# Patient Record
Sex: Female | Born: 1950 | Race: Black or African American | Hispanic: No | Smoking: Current every day smoker
Health system: Southern US, Community
[De-identification: ages and names within clinical notes are randomized; demographics above are authoritative.]

## PROBLEM LIST (undated history)

## (undated) DIAGNOSIS — I10 Essential (primary) hypertension: Secondary | ICD-10-CM

## (undated) DIAGNOSIS — M1990 Unspecified osteoarthritis, unspecified site: Secondary | ICD-10-CM

## (undated) HISTORY — PX: CARPAL TUNNEL RELEASE: SHX101

## (undated) HISTORY — PX: JOINT REPLACEMENT: SHX530

## (undated) HISTORY — PX: BREAST SURGERY: SHX581

---

## 2001-07-14 ENCOUNTER — Encounter (HOSPITAL_COMMUNITY): Admission: RE | Admit: 2001-07-14 | Discharge: 2001-08-13 | Payer: Self-pay

## 2001-10-26 ENCOUNTER — Encounter: Payer: Self-pay | Admitting: Emergency Medicine

## 2001-10-26 ENCOUNTER — Emergency Department (HOSPITAL_COMMUNITY): Admission: EM | Admit: 2001-10-26 | Discharge: 2001-10-26 | Payer: Self-pay | Admitting: Emergency Medicine

## 2002-04-19 ENCOUNTER — Encounter (HOSPITAL_COMMUNITY): Admission: RE | Admit: 2002-04-19 | Discharge: 2002-05-19 | Payer: Self-pay | Admitting: Obstetrics and Gynecology

## 2002-05-27 ENCOUNTER — Encounter (HOSPITAL_COMMUNITY): Admission: RE | Admit: 2002-05-27 | Discharge: 2002-06-26 | Payer: Self-pay | Admitting: Obstetrics and Gynecology

## 2005-06-16 ENCOUNTER — Emergency Department (HOSPITAL_COMMUNITY): Admission: EM | Admit: 2005-06-16 | Discharge: 2005-06-16 | Payer: Self-pay | Admitting: Emergency Medicine

## 2012-07-06 ENCOUNTER — Encounter (HOSPITAL_COMMUNITY): Payer: Self-pay | Admitting: Emergency Medicine

## 2012-07-06 ENCOUNTER — Emergency Department (HOSPITAL_COMMUNITY): Payer: Non-veteran care

## 2012-07-06 ENCOUNTER — Emergency Department (HOSPITAL_COMMUNITY)
Admission: EM | Admit: 2012-07-06 | Discharge: 2012-07-07 | Disposition: A | Discharge status: Home / Self Care | Payer: Non-veteran care | Attending: Emergency Medicine | Admitting: Emergency Medicine

## 2012-07-06 DIAGNOSIS — W010XXA Fall on same level from slipping, tripping and stumbling without subsequent striking against object, initial encounter: Secondary | ICD-10-CM | POA: Insufficient documentation

## 2012-07-06 DIAGNOSIS — S86819A Strain of other muscle(s) and tendon(s) at lower leg level, unspecified leg, initial encounter: Secondary | ICD-10-CM | POA: Insufficient documentation

## 2012-07-06 DIAGNOSIS — S93609A Unspecified sprain of unspecified foot, initial encounter: Secondary | ICD-10-CM | POA: Insufficient documentation

## 2012-07-06 DIAGNOSIS — I10 Essential (primary) hypertension: Secondary | ICD-10-CM | POA: Insufficient documentation

## 2012-07-06 DIAGNOSIS — F17200 Nicotine dependence, unspecified, uncomplicated: Secondary | ICD-10-CM | POA: Insufficient documentation

## 2012-07-06 DIAGNOSIS — S93601A Unspecified sprain of right foot, initial encounter: Secondary | ICD-10-CM

## 2012-07-06 DIAGNOSIS — S838X9A Sprain of other specified parts of unspecified knee, initial encounter: Secondary | ICD-10-CM | POA: Insufficient documentation

## 2012-07-06 DIAGNOSIS — S8391XA Sprain of unspecified site of right knee, initial encounter: Secondary | ICD-10-CM

## 2012-07-06 DIAGNOSIS — E119 Type 2 diabetes mellitus without complications: Secondary | ICD-10-CM | POA: Insufficient documentation

## 2012-07-06 HISTORY — DX: Essential (primary) hypertension: I10

## 2012-07-06 HISTORY — DX: Unspecified osteoarthritis, unspecified site: M19.90

## 2012-07-06 MED ORDER — OXYCODONE-ACETAMINOPHEN 5-325 MG PO TABS
2.0000 | ORAL_TABLET | Freq: Once | ORAL | Status: AC
  Administered 2012-07-07: 2 via ORAL
  Filled 2012-07-06: qty 2

## 2012-07-06 NOTE — ED Notes (Signed)
Fall with injury to right lower leg - states leg "just gives away"  Treated with ice and elevation last pm - continues to have pain today.  Feels like something "pulled behind knee all the way down to ankle.  Very sore.

## 2012-07-06 NOTE — ED Provider Notes (Signed)
History     CSN: [id number]  Arrival date & time 07/06/12  2224   First MD Initiated Contact with Patient 07/06/12 2347      Chief Complaint  Patient presents with  . Fall    injury right leg    (Consider location/radiation/quality/duration/timing/severity/associated sxs/prior treatment) HPI Comments: Pt with several operations on her right knee in the past status post total knee arthroplasty with multiple revisions. She states that this knee goes out on her frequently causing her to fall. Today this happened while she was on a wooden surface causing her to fall to her right side and twisted her right leg under her body. She felt acute onset of pain behind her knee as well as in her ankle and has had difficulty walking since that time. She denies numbness, weakness, tingling and has no head injury or neck or back pain outside of her chronic back pain. 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  Patient is a 61 y.o. female presenting with fall. The history is provided by the patient and a friend.  Fall Pertinent negatives include no numbness.    Past Medical History  Diagnosis Date  . Arthritis   . Diabetes mellitus   . Hypertension     Past Surgical History  Procedure Date  . Joint replacement 1992 and 2001    right knee  . Breast surgery     bio-psy negative  . Carpal tunnel release     right    No family history on file.  History  Substance Use Topics  . Smoking status: Current Everyday Smoker    Types: Cigarettes  . Smokeless tobacco: Not on file  . Alcohol Use: No    OB History    Grav Para Term Preterm Abortions TAB SAB Ect Mult Living                  Review of Systems  Musculoskeletal: Positive for joint swelling.  Skin: Negative for wound.  Neurological: Negative for weakness and numbness.    Allergies  Review of patient's allergies indicates no known allergies.  Home Medications   Current Outpatient Rx    Name Route Sig Dispense Refill  . ASPIRIN 81 MG PO TABS Oral Take 81 mg by mouth daily.    Marland Kitchen GABAPENTIN 300 MG PO CAPS Oral Take 300 mg by mouth 3 (three) times daily.     Marland Kitchen HYDROCHLOROTHIAZIDE 25 MG PO TABS Oral Take 25 mg by mouth daily.    Marland Kitchen METFORMIN HCL 1000 MG PO TABS Oral Take 1,000 mg by mouth 3 (three) times daily between meals.     . METHADONE HCL 5 MG PO TABS Oral Take 5 mg by mouth every 8 (eight) hours. Back pain     . NAPROXEN 500 MG PO TABS Oral Take 1 tablet (500 mg total) by mouth 2 (two) times daily with a meal. 30 tablet 0    BP 121/61  Pulse 89  Temp 98.5 F (36.9 C) (Oral)  Resp 16  Ht 6' (1.829 m)  Wt 210 lb (95.255 kg)  BMI 28.48 kg/m2  SpO2 96%  Physical Exam  Nursing note and vitals reviewed. Constitutional: She appears well-developed and well-nourished. No distress.  HENT:  Head: Normocephalic and atraumatic.  Mouth/Throat: Oropharynx is clear and moist. No oropharyngeal exudate.  Eyes: Conjunctivae and EOM are normal. Pupils are equal, round, and reactive to light. Right eye exhibits no discharge. Left  eye exhibits no discharge. No scleral icterus.  Neck: Normal range of motion. Neck supple. No JVD present. No thyromegaly present.  Cardiovascular: Normal rate, regular rhythm, normal heart sounds and intact distal pulses.  Exam reveals no gallop and no friction rub.   No murmur heard. Pulmonary/Chest: Effort normal and breath sounds normal. No respiratory distress. She has no wheezes. She has no rales.  Abdominal: Soft. Bowel sounds are normal. She exhibits no distension and no mass. There is no tenderness.  Musculoskeletal: She exhibits tenderness ( mild swelling of the right knee, anterior scar well-healed, no erythema or warmth. Knee with decreased range of motion, right ankle with decreased range of motion, no tenderness over the malleoli bilaterally. There is midfoot tenderness right foott). She exhibits no edema.  Lymphadenopathy:    She has no  cervical adenopathy.  Neurological: She is alert. Coordination normal.       Normal sensation of the right lower extremity, no focal weakness  Skin: Skin is warm and dry. No rash noted. No erythema.  Psychiatric: She has a normal mood and affect. Her behavior is normal.    ED Course  Procedures (including critical care time)  Labs Reviewed - No data to display Dg Ankle Complete Right  07/07/2012  *RADIOLOGY REPORT*  Clinical Data: Fall.  Pain and swelling.  RIGHT ANKLE - COMPLETE 3+ VIEW  Comparison: None.  Findings: Imaged bones, joints and soft tissues appear normal.  IMPRESSION: Negative exam.   Original Report Authenticated By: Bernadene Bell. D'ALESSIO, M.D.    Dg Knee Complete 4 Views Right  07/07/2012  *RADIOLOGY REPORT*  Clinical Data: Fall, pain.  RIGHT KNEE - COMPLETE 4+ VIEW  Comparison: None.  Findings: Right total knee replacement is noted.  The device is located.  There is no fracture.  No joint effusion.  IMPRESSION: No acute finding.  Status post right knee replacement without evidence of complication.   Original Report Authenticated By: Bernadene Bell. D'ALESSIO, M.D.    Dg Foot Complete Right  07/07/2012  *RADIOLOGY REPORT*  Clinical Data: Fall.  Pain and swelling.  RIGHT FOOT COMPLETE - 3+ VIEW  Comparison: None.  Findings: Imaged bones, joints and soft tissues appear normal.  IMPRESSION: Negative exam.   Original Report Authenticated By: Bernadene Bell. D'ALESSIO, M.D.      1. Sprain of right knee   2. Sprain of right foot       MDM  Status post fall, possible fractures though suspect more strain and ligamentous injury, pain medication ordered, imaging, immobilization, ice and elevation.   Imaging reviewed and is negative for fractures or dislocations, knee immobilizer placed, rice therapy initiated, patient stable for discharge and followup with her orthopedist.  Discharge Prescriptions include:  Naprosyn Knee immob RICE therapy     Vida Roller, MD 07/07/12 [phone]

## 2012-07-07 MED ORDER — NAPROXEN 500 MG PO TABS: 500.0000 mg | ORAL_TABLET | Freq: Two times a day (BID) | ORAL | Status: AC

## 2013-01-09 IMAGING — CR DG ANKLE COMPLETE 3+V*R*
3 series · 3 of 3 positions shown · non-contrast
Comparison: None.

CLINICAL DATA: Fall.  Pain and swelling.

RIGHT ANKLE - COMPLETE 3+ VIEW

[view not recorded (1 of 3)]
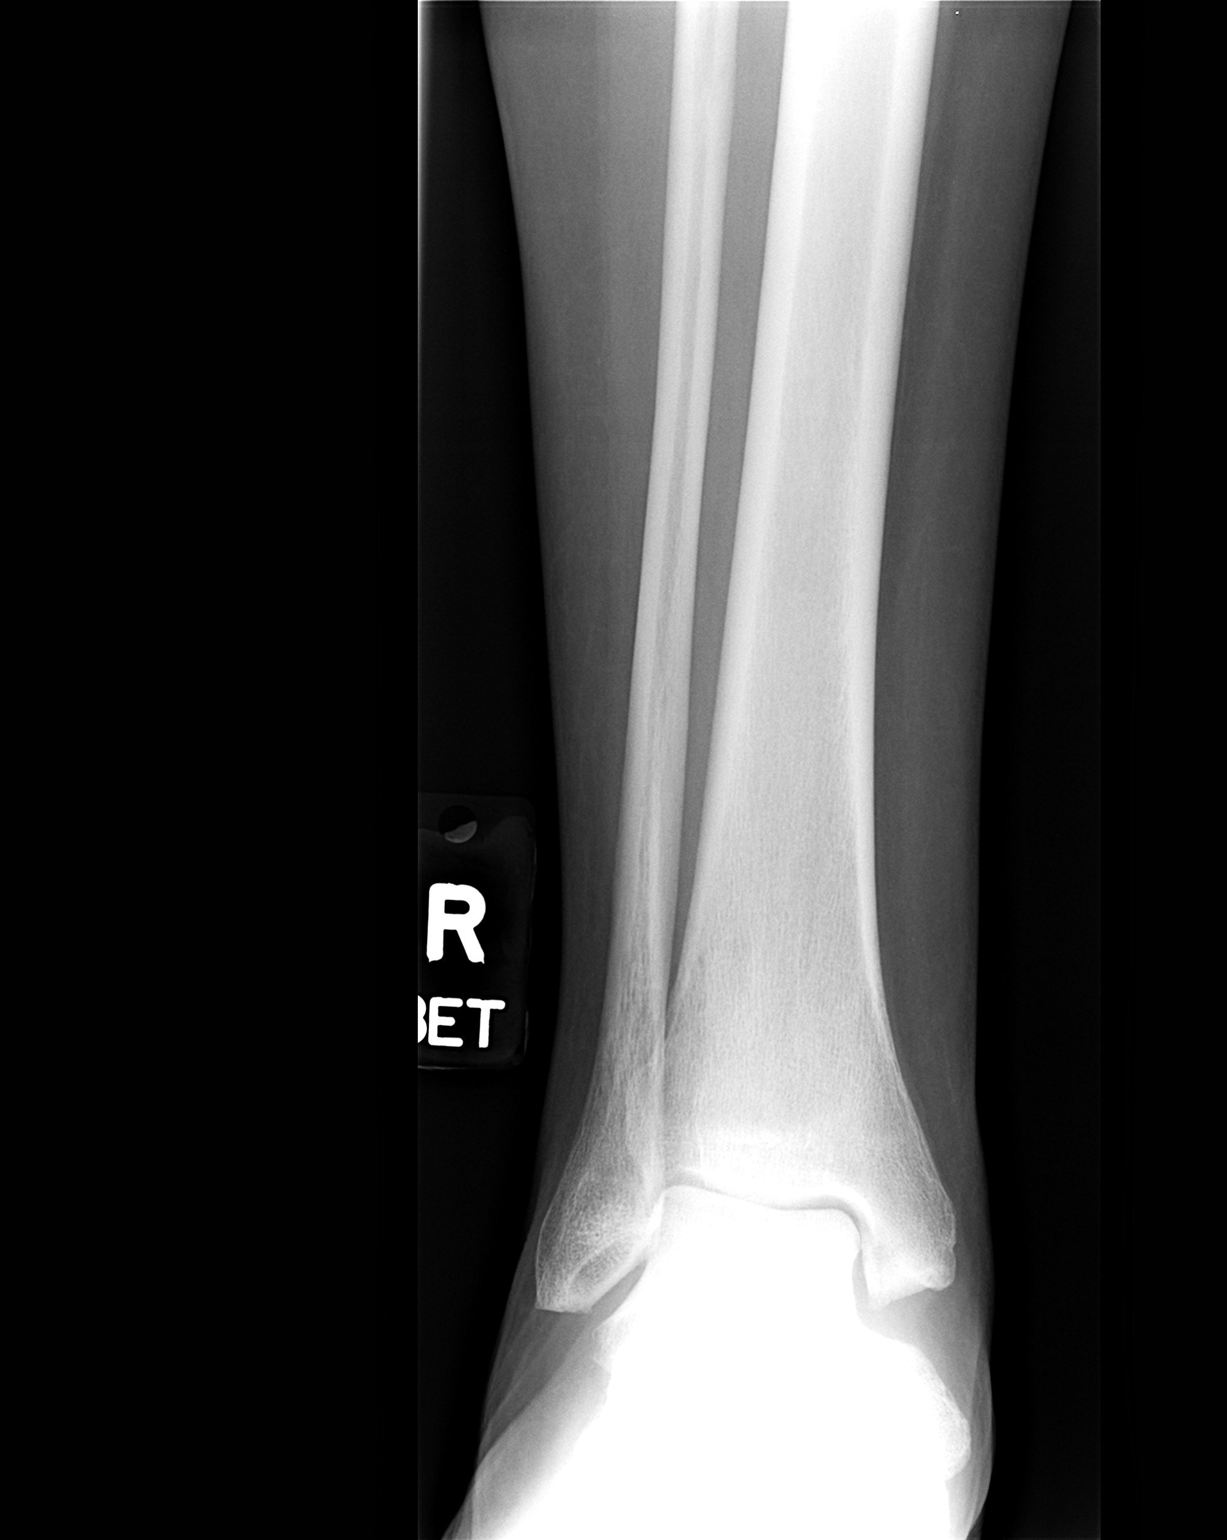

[view not recorded (2 of 3)]
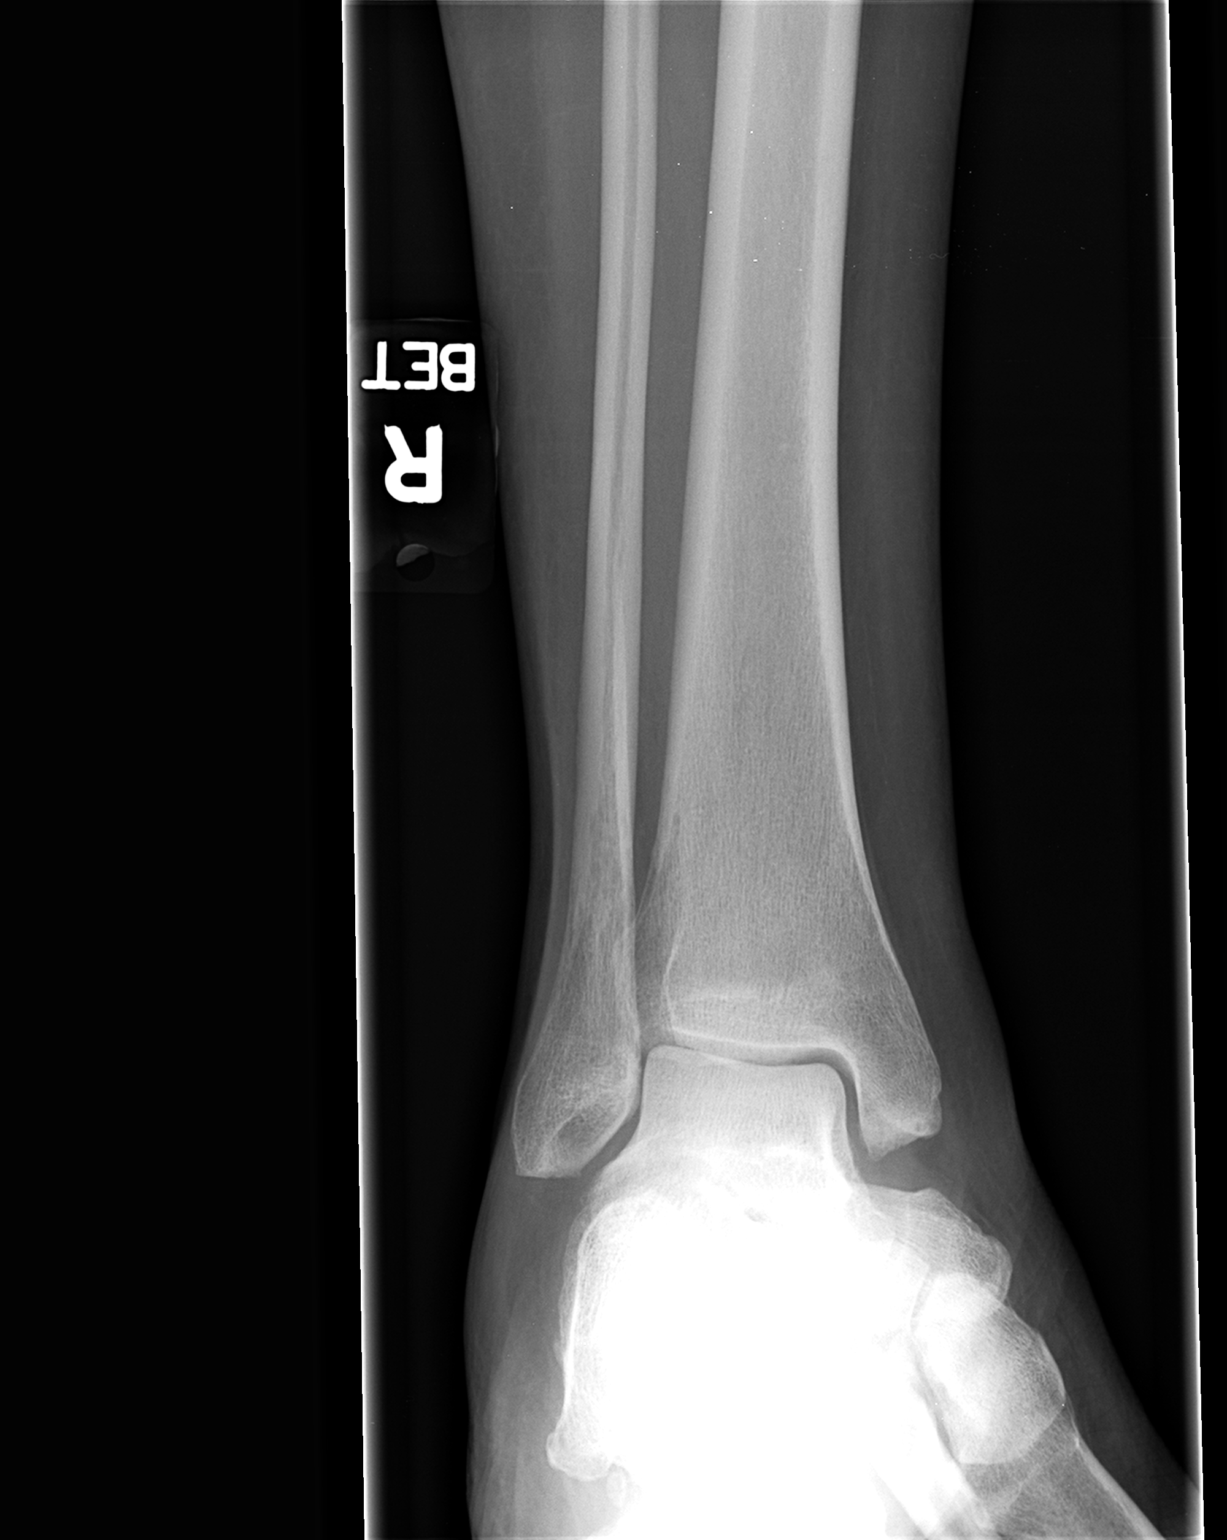

[view not recorded (3 of 3)]
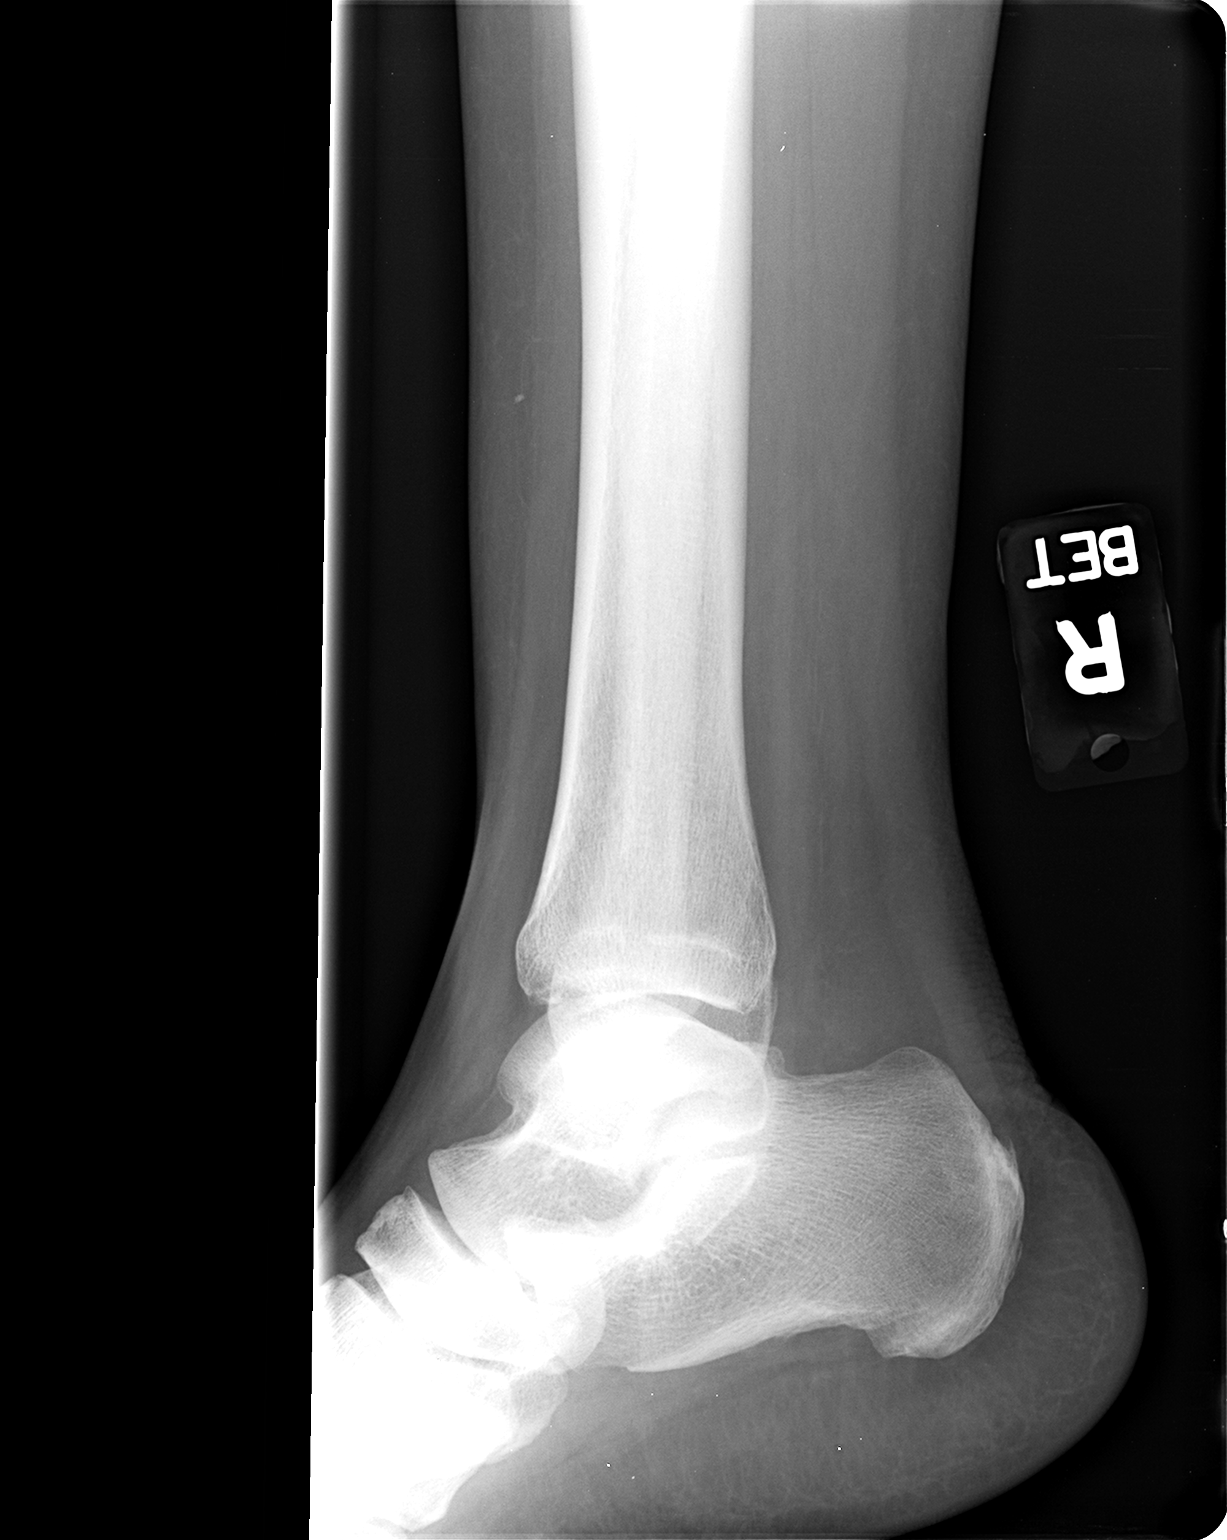

[3 of 3 positions shown; findings below may reference images not displayed]

FINDINGS: Imaged bones, joints and soft tissues appear normal.
IMPRESSION: Negative exam.

## 2014-03-27 ENCOUNTER — Ambulatory Visit (HOSPITAL_COMMUNITY): Payer: Non-veteran care | Admitting: Physical Therapy

## 2014-03-29 ENCOUNTER — Ambulatory Visit (HOSPITAL_COMMUNITY)
Admission: RE | Admit: 2014-03-29 | Discharge: 2014-03-29 | Disposition: A | Discharge status: Home / Self Care | Payer: Non-veteran care | Source: Ambulatory Visit | Attending: *Deleted | Admitting: *Deleted

## 2014-03-29 DIAGNOSIS — M25569 Pain in unspecified knee: Secondary | ICD-10-CM | POA: Insufficient documentation

## 2014-03-29 DIAGNOSIS — IMO0001 Reserved for inherently not codable concepts without codable children: Secondary | ICD-10-CM | POA: Insufficient documentation

## 2014-03-29 DIAGNOSIS — E119 Type 2 diabetes mellitus without complications: Secondary | ICD-10-CM | POA: Insufficient documentation

## 2014-03-29 DIAGNOSIS — R29898 Other symptoms and signs involving the musculoskeletal system: Secondary | ICD-10-CM | POA: Insufficient documentation

## 2014-03-29 DIAGNOSIS — M25519 Pain in unspecified shoulder: Secondary | ICD-10-CM | POA: Insufficient documentation

## 2014-03-29 DIAGNOSIS — R262 Difficulty in walking, not elsewhere classified: Secondary | ICD-10-CM | POA: Insufficient documentation

## 2014-03-29 NOTE — Evaluation (Signed)
Physical Therapy Evaluation  Patient Details  Name: Vicki IgoSheila Murphy MRN: [id number] Date of Birth: 01/19/1951  Today's Date: 03/29/2014 Time: [id number] PT Time Calculation (min): 45 min Charge:  Evaluation.             Visit#: 1 of 24  Re-eval: 04/28/14 Assessment Diagnosis: L shoulder/B knee pain  Authorization: [facility]    Authorization Time Period: now thru 08/09/2014  Authorization Visit#: 1 of 24   Past Medical History:  Past Medical History  Diagnosis Date  . Arthritis   . Diabetes mellitus   . Hypertension    Past Surgical History:  Past Surgical History  Procedure Laterality Date  . Joint replacement  1992 and 2001    right knee  . Breast surgery      bio-psy negative  . Carpal tunnel release      right    Subjective Symptoms/Limitations Symptoms: Vicki Murphy states that she has been wearing  an AFO and a LT KAFO since 09/2013.  She states both of her knees bother her know.  She was having the most pain in her right knee but due to having to put so much pressure on her Left knee her knee began to bother her.  She fell in June of 2014 and this is when her pain really started.  She is walking with a cane for short distances.    The pt states she has been having pain in her Lt shoulder for years.  She pulled her bicep tendon and has had pain ever since.  She is now being referred for therapy for her knees and  her Lt shoulder.   Pertinent History: DM, neuropathy, 2 cervical HNP; 2 lumbar HNP,  How long can you sit comfortably?: able to sit for 5-10 minutes but it is difficult to come sit to stand.  How long can you stand comfortably?: able to stand for ten minutes. How long can you walk comfortably?: walking with braces and cane for 15 minutes without the cane and braces she can only walk room to room in the house.   Pain Assessment Currently in Pain?: Yes Pain Score: 10-Worst pain ever Pain Location: Knee Pain Orientation: Right Pain Onset: More than a month ago Pain  Frequency: Constant Pain Relieving Factors: ice Effect of Pain on Daily Activities: increases  Multiple Pain Sites: Yes  Precautions/Restrictions    falls Balance Screening Balance Screen Has the patient fallen in the past 6 months: Yes How many times?: 4 Has the patient had a decrease in activity level because of a fear of falling? : No Is the patient reluctant to leave their home because of a fear of falling? : No       Sensation/Coordination/Flexibility/Functional Tests Functional Tests Functional Tests: foto 38  Assessment LUE AROM (degrees) Left Shoulder Flexion: 170 Degrees Left Shoulder ABduction: 170 Degrees Left Shoulder Internal Rotation: 80 Degrees Left Shoulder External Rotation: 80 Degrees LUE Strength Left Shoulder Flexion: 5/5 Left Shoulder ABduction: 5/5 Left Shoulder Internal Rotation: 4/5 Left Shoulder External Rotation: 3-/5 RLE Strength Right Hip Flexion: 2/5 Right Hip Extension: 2-/5 Right Hip ABduction: 2-/5 Right Knee Flexion: 2-/5 Right Knee Extension: 3+/5 LLE Strength Left Hip Flexion: 2+/5 Left Hip Extension: 2-/5 Left Hip ABduction: 3/5 Left Knee Flexion: 3/5 Left Knee Extension: 4/5 Palpation Palpation:  (tender to Rt greater trochanter.)  Exercise/Treatments       Seated Long Arc Quad: Right;10 reps Supine Heel Slides: 10 reps Bridges: 5 reps Other Supine Knee Exercises: bent knee  raise ; hip ab/adduction.      Physical Therapy Assessment and Plan PT Assessment and Plan Pt will benefit from skilled therapeutic intervention in order to improve on the following deficits: Decreased activity tolerance;Pain;Decreased strength;Difficulty walking Rehab Potential: Good PT Frequency: Min 3X/week PT Duration: 8 weeks PT Treatment/Interventions: Therapeutic activities;Therapeutic exercise;Manual techniques;Modalities (Pt okayed for US and ionto) PT Plan: begin LE strengthening as well as Lt ER strengthening and stabilization;  ionto to Rt greater trochanter.      Goals Home Exercise Program Pt/caregiver will Perform Home Exercise Program: For increased strengthening PT Short Term Goals Time to Complete Short Term Goals: 2 weeks PT Short Term Goal 1: Pt to state that it is 50% easier to come sit to stand PT Short Term Goal 2: able to sit for 20 minutes PT Short Term Goal 3: able to stand for 20 minute to be able to make a small meal PT Short Term Goal 4: Able to walk for 30 minutes for better health habits.  PT Long Term Goals Time to Complete Long Term Goals: 4 weeks PT Long Term Goal 1: Pt pain to be no greater than a 7/10 PT Long Term Goal 2: Pt to be able to sit for 45 minutes Long Term Goal 3: Pt to state coming sit to stand is 50% easier Long Term Goal 4: Pt to be able to stand for 30 minutes to make a meal PT Long Term Goal 5: PT to be able to walk for 40 minutes for better health habits   Problem List Patient Active Problem List   Diagnosis Date Noted  . Leg weakness, bilateral 03/29/2014  . Shoulder weakness 03/29/2014  . Difficulty in walking(719.7) 03/29/2014    General Behavior During Therapy: [facility] for tasks assessed/performed PT Plan of Care PT Home Exercise Plan: given  Consulted and Agree with Plan of Care: Patient  GP Functional Assessment Tool Used: foto Functional Limitation: Mobility: Walking and moving around Mobility: Walking and Moving Around Current Status ([id number](G8978): At least 60 percent but less than 80 percent impaired, limited or restricted Mobility: Walking and Moving Around Goal Status [phone](G8979): At least 40 percent but less than 60 percent impaired, limited or restricted  Bella KennedyCynthia J Russell 03/29/2014, 5:31 PM  Physician Documentation Your signature is required to indicate approval of the treatment plan as stated above.  Please sign and either send electronically or make a copy of this report for your files and return this physician signed original.   Please mark one  1.__approve of plan  2. ___approve of plan with the following conditions.   ______________________________                                                          _____________________ Physician Signature  Date  

## 2014-04-25 ENCOUNTER — Ambulatory Visit (HOSPITAL_COMMUNITY)
Admission: RE | Admit: 2014-04-25 | Discharge: 2014-04-25 | Disposition: A | Discharge status: Home / Self Care | Payer: Non-veteran care | Source: Ambulatory Visit | Attending: *Deleted | Admitting: *Deleted

## 2014-04-25 DIAGNOSIS — R29898 Other symptoms and signs involving the musculoskeletal system: Secondary | ICD-10-CM | POA: Diagnosis not present

## 2014-04-25 DIAGNOSIS — E119 Type 2 diabetes mellitus without complications: Secondary | ICD-10-CM | POA: Insufficient documentation

## 2014-04-25 DIAGNOSIS — R262 Difficulty in walking, not elsewhere classified: Secondary | ICD-10-CM | POA: Diagnosis not present

## 2014-04-25 DIAGNOSIS — IMO0001 Reserved for inherently not codable concepts without codable children: Secondary | ICD-10-CM | POA: Insufficient documentation

## 2014-04-25 DIAGNOSIS — M25519 Pain in unspecified shoulder: Secondary | ICD-10-CM | POA: Insufficient documentation

## 2014-04-25 DIAGNOSIS — M25569 Pain in unspecified knee: Secondary | ICD-10-CM | POA: Insufficient documentation

## 2014-04-25 NOTE — Progress Notes (Signed)
Physical Therapy Treatment Patient Details  Name: Jobe IgoSheila Cole-Rozema MRN: [id number] Date of Birth: 04/21/1951  Today's Date: 04/25/2014 Time: 4098-11911438-1526 PT Time Calculation (min): 48 min Charge there ex 1438-1510; ionto 1510-1525  Visit#: 2 of 24  Re-eval: 04/28/14    Authorization: [facility]  Authorization Time Period: now thru 08/09/2014  Authorization Visit#: 2 of 24   Subjective: Symptoms/Limitations Symptoms: Ms. Richardson DoppCole states that she is trying to do her exercises but they hurt; shoulder is feeling better  Pain Assessment Currently in Pain?: Yes Pain Score: 10-Worst pain ever Pain Location: Knee Pain Orientation: Right  Exercise/Treatments   Standing Lateral Step Up: 2 sets;10 reps;Step Height: 2" Forward Step Up: 2 sets;Both;10 reps Functional Squat: 10 reps (with chair behind pt ) Rocker Board: 2 minutes SLS with Vectors: B 5 second hold x 3  Seated Other Seated Knee Exercises: 2# Lt shoulder ER with leg adducted.     Modalities Modalities: Iontophoresis Iontophoresis Type of Iontophoresis: Dexamethasone Location: Rt hip  Dose: 60mA Time: 15:00  Physical Therapy Assessment and Plan PT Assessment and Plan Clinical Impression Statement: PT late for appointment.  Pt needed continual verbal and manual cuing in order to keep correct form with exercises.  Today treatment focused more on pt knee pain than shoulder .   PT Plan: begin SL ER and shld abduction as well as standing and supine terminal extension.   Assess how ionto did for pain relief.  Goals  progressing  Problem List Patient Active Problem List   Diagnosis Date Noted  . Leg weakness, bilateral 03/29/2014  . Shoulder weakness 03/29/2014  . Difficulty in walking(719.7) 03/29/2014       GP    RUSSELL,CINDY 04/25/2014, 4:33 PM

## 2014-05-02 ENCOUNTER — Inpatient Hospital Stay (HOSPITAL_COMMUNITY): Admission: RE | Admit: 2014-05-02 | Payer: Non-veteran care | Source: Ambulatory Visit | Admitting: Physical Therapy

## 2014-05-03 ENCOUNTER — Inpatient Hospital Stay (HOSPITAL_COMMUNITY): Admission: RE | Admit: 2014-05-03 | Payer: Non-veteran care | Source: Ambulatory Visit | Admitting: Physical Therapy

## 2014-05-17 ENCOUNTER — Ambulatory Visit (HOSPITAL_COMMUNITY)
Admission: RE | Admit: 2014-05-17 | Discharge: 2014-05-17 | Disposition: A | Discharge status: Home / Self Care | Payer: Non-veteran care | Source: Ambulatory Visit | Attending: *Deleted | Admitting: *Deleted

## 2014-05-17 DIAGNOSIS — M25569 Pain in unspecified knee: Secondary | ICD-10-CM | POA: Diagnosis not present

## 2014-05-17 DIAGNOSIS — R262 Difficulty in walking, not elsewhere classified: Secondary | ICD-10-CM | POA: Diagnosis not present

## 2014-05-17 DIAGNOSIS — E119 Type 2 diabetes mellitus without complications: Secondary | ICD-10-CM | POA: Insufficient documentation

## 2014-05-17 DIAGNOSIS — M25519 Pain in unspecified shoulder: Secondary | ICD-10-CM | POA: Diagnosis not present

## 2014-05-17 DIAGNOSIS — R29898 Other symptoms and signs involving the musculoskeletal system: Secondary | ICD-10-CM | POA: Insufficient documentation

## 2014-05-17 DIAGNOSIS — IMO0001 Reserved for inherently not codable concepts without codable children: Secondary | ICD-10-CM | POA: Diagnosis not present

## 2014-05-17 NOTE — Progress Notes (Signed)
Physical Therapy Treatment Patient Details  Name: Vicki Murphy MRN: [id number] Date of Birth: 11/08/1951  Today's Date: 05/17/2014 Time: 4696-29521305-1345 PT Time Calculation (min): 40 min  Visit#: 3 of 24     Authorization: [facility]  Authorization Time Period: now thru 08/09/2014  Authorization Visit#: 3 of 24   Subjective: Symptoms/Limitations Symptoms: Pt reports pain level 7/10 in (B) knees and Rt hip, and 4/10 in Lt shoulder. She reports a death in the family last week, and has been riding a car a lot [place] and back; she reports she was able to sit in the car during the drive for ~2 hours.  Pain Assessment Currently in Pain?: Yes Pain Score: 7  Pain Location: Knee Pain Orientation: Right;Left Pain Type: Chronic pain   Exercise/Treatments Standing Lateral Step Up: 10 reps;Step Height: 4";Both Forward Step Up: 10 reps;Step Height: 4";Both Functional Squat: 10 reps (with chair behind pt) Supine Straight Leg Raises: 2 sets;5 reps;Both Sidelying Hip ABduction: 2 sets;5 sets;Both Other Sidelying Knee Exercises: Shoulder ER on Lt, 2#, 2x10  Physical Therapy Assessment and Plan PT Assessment and Plan Clinical Impression Statement: Pt reports increased knee pain and hip pain today vs. shoulder pain, and treatment focused on LE strengthening.  Pt was able to increase height of box today with step up forward and lateral, though required 1 HHA to complete task.  Initiated SLR in flexion and abduction on the table, with VC for controlled mvmt.  Pt reports difficulty wtih exercise secondary to muscle weakness and inability to lift leg >5 inches in each direction.  Some discomfort reported with sidelying on Rt hip, though pt able to perform SLR and SL ER while in this position.  Pt due for re-eval next visit, though only 3 visits attended since IE as pt has been unable to consistently attend therapy.     Pt will benefit from skilled therapeutic intervention in order to improve on  the following deficits: Decreased activity tolerance;Pain;Decreased strength;Difficulty walking Rehab Potential: Good PT Frequency: Min 3X/week PT Duration: 8 weeks PT Treatment/Interventions: Therapeutic activities;Therapeutic exercise;Manual techniques;Modalities PT Plan: Re-assessment next visit.      Problem List Patient Active Problem List   Diagnosis Date Noted  . Leg weakness, bilateral 03/29/2014  . Shoulder weakness 03/29/2014  . Difficulty in walking(719.7) 03/29/2014    PT - End of Session Activity Tolerance: Patient tolerated treatment well General Behavior During Therapy: [facility] for tasks assessed/performed   Atom Solivan 05/17/2014, 1:50 PM

## 2014-05-22 ENCOUNTER — Ambulatory Visit (HOSPITAL_COMMUNITY): Payer: Non-veteran care | Admitting: Physical Therapy

## 2014-05-25 ENCOUNTER — Ambulatory Visit (HOSPITAL_COMMUNITY): Payer: Non-veteran care | Admitting: Physical Therapy

## 2014-05-25 ENCOUNTER — Telehealth (HOSPITAL_COMMUNITY): Payer: Self-pay

## 2014-05-31 ENCOUNTER — Ambulatory Visit (HOSPITAL_COMMUNITY): Payer: Non-veteran care | Admitting: Physical Therapy

## 2017-07-23 ENCOUNTER — Telehealth (HOSPITAL_COMMUNITY): Payer: Self-pay | Admitting: Physical Therapy

## 2017-07-23 NOTE — Telephone Encounter (Signed)
Physician has been added to epic and assigned to pt's apptment. NF 07/23/17

## 2017-08-03 ENCOUNTER — Ambulatory Visit (HOSPITAL_COMMUNITY): Payer: Non-veteran care | Admitting: Physical Therapy

## 2017-08-03 ENCOUNTER — Telehealth (HOSPITAL_COMMUNITY): Payer: Self-pay | Admitting: Physical Therapy

## 2017-08-03 ENCOUNTER — Encounter (HOSPITAL_COMMUNITY): Payer: Self-pay

## 2017-08-03 NOTE — Telephone Encounter (Signed)
She is not feeling well today and want to reschedule her appt

## 2017-08-05 ENCOUNTER — Encounter (HOSPITAL_COMMUNITY): Payer: Self-pay

## 2017-08-05 ENCOUNTER — Ambulatory Visit (HOSPITAL_COMMUNITY): Payer: Non-veteran care | Attending: Rehabilitative and Restorative Service Providers"

## 2017-08-05 DIAGNOSIS — M6281 Muscle weakness (generalized): Secondary | ICD-10-CM

## 2017-08-05 DIAGNOSIS — R29898 Other symptoms and signs involving the musculoskeletal system: Secondary | ICD-10-CM | POA: Diagnosis present

## 2017-08-05 DIAGNOSIS — R296 Repeated falls: Secondary | ICD-10-CM | POA: Diagnosis present

## 2017-08-05 DIAGNOSIS — M79604 Pain in right leg: Secondary | ICD-10-CM | POA: Diagnosis present

## 2017-08-05 DIAGNOSIS — M79605 Pain in left leg: Secondary | ICD-10-CM | POA: Diagnosis present

## 2017-08-05 DIAGNOSIS — R2681 Unsteadiness on feet: Secondary | ICD-10-CM

## 2017-08-05 NOTE — Therapy (Signed)
[facility] [address] [place], [place], [address] Phone: [phone]   Fax:  [phone]  Physical Therapy Evaluation  Patient Details  Name: Vicki Murphy MRN: [id number] Date of Birth: 01-11-1951 Referring Provider: Ronie Spies  Encounter Date: 08/05/2017      PT End of Session - 08/05/17 1405    Visit Number 1   Number of Visits 14   Date for PT Re-Evaluation 08/26/17   Authorization Type [facility] (14 approved visits including 1 eval, 12 f/u visits, 1 re-eval if indicated)   Authorization Time Period 08/05/17 to 09/16/17   Authorization - Visit Number 1   Authorization - Number of Visits 14   PT Start Time 1310   PT Stop Time 1356   PT Time Calculation (min) 46 min   Activity Tolerance Patient tolerated treatment well   Behavior During Therapy [facility] for tasks assessed/performed      Past Medical History:  Diagnosis Date  . Arthritis   . Diabetes mellitus   . Hypertension     Past Surgical History:  Procedure Laterality Date  . BREAST SURGERY     bio-psy negative  . CARPAL TUNNEL RELEASE     right  . JOINT REPLACEMENT  1992 and 2001   right knee    There were no vitals filed for this visit.       Subjective Assessment - 08/05/17 1404    Subjective Pt states that she has been having worsening ankle pain for about 3 months, which initially began about 1 year ago when her feet "cracked open". She is unsure if she stepped out of the shower wrong or not to make her medial/lateral ankle hurt. She has been ambulating with a SPC for the last 4 months due to increasing deficits in balance and weakness. Bilateral ankles hurt from the outside and up to her knees and worsen with ambulation. Pt also c/o R>L hip pain which also worsens with walking. Pt has fallen about 20 times in the last 6 months. She states that her legs will buckle on her and cause her to falls. She reports she has diabetic neuropathy in both feet.    Limitations  Walking;House hold activities;Lifting   How long can you sit comfortably? no issues with sitting, hurts to stand up   How long can you stand comfortably? not long   How long can you walk comfortably? not long   Patient Stated Goals to get balance better   Currently in Pain? No/denies            [facility] PT Assessment - 08/05/17 0001      Assessment   Medical Diagnosis other abnormalities of gait   Referring Provider Tawny Kross   Onset Date/Surgical Date --  symptoms began worsening 3 months ago   Prior Therapy yes     Precautions   Precautions Fall     Balance Screen   Has the patient fallen in the past 6 months Yes   How many times? about 20   Has the patient had a decrease in activity level because of a fear of falling?  Yes   Is the patient reluctant to leave their home because of a fear of falling?  No     Prior Function   Level of Independence Independent     Posture/Postural Control   Posture/Postural Control Postural limitations   Postural Limitations Rounded Shoulders;Forward head;Increased thoracic kyphosis     ROM / Strength   AROM /  PROM / Strength Strength     Strength   Right Hip Flexion 4-/5   Right Hip Extension 2+/5   Left Hip Flexion 4-/5   Left Hip Extension 2+/5   Left Hip ABduction 3+/5   Right Knee Flexion 4/5   Right Knee Extension 4/5   Left Knee Flexion 4+/5   Left Knee Extension 4+/5   Right Ankle Dorsiflexion 3+/5   Right Ankle Inversion 4/5   Right Ankle Eversion 3+/5   Left Ankle Dorsiflexion 4/5   Left Ankle Inversion 5/5   Left Ankle Eversion 5/5     Flexibility   Soft Tissue Assessment /Muscle Length yes   Hamstrings WNL   Quadriceps +Ely's BLE   Piriformis R tighter than L, but both WFL     Palpation   Palpation comment increased soft tissue restrictions of bil gastroc/soleus, peroneals, medial ankle musculature, bil glutes, piriformis     Ambulation/Gait   Ambulation Distance (Feet) 30 Feet  in gym   Gait Comments pt  demo's slow, unsteady gait with SPC. Pt had 1 major LOB during ambulation in clinic which required max A to recover from     Balance   Balance Assessed Yes     Static Standing Balance   Static Standing - Balance Support No upper extremity supported   Static Standing Balance -  Activities  Single Leg Stance - Right Leg;Single Leg Stance - Left Leg   Static Standing - Comment/# of Minutes 3 sec or < BLE     Standardized Balance Assessment   Standardized Balance Assessment Five Times Sit to Stand;Timed Up and Go Test   Five times sit to stand comments  1:11 from chair with BUE     Timed Up and Go Test   Normal TUG (seconds) 35  SPC       Objective measurements completed on examination: See above findings.         PT Education - 08/05/17 1405    Education provided Yes   Education Details exam findings, POC, HEP   Person(s) Educated Patient   Methods Explanation;Demonstration;Handout   Comprehension Verbalized understanding;Returned demonstration          PT Short Term Goals - 08/05/17 1417      PT SHORT TERM GOAL #1   Title Pt will be independent with HEP and perform consistently to maximize return to PLOF.    Time 3   Period Weeks   Status New   Target Date 08/26/17     PT SHORT TERM GOAL #2   Title Pt will have improved MMT in all muscle groups tested by at least 1/2 grade to maximize gait and balance.   Time 3   Period Weeks   Status New     PT SHORT TERM GOAL #3   Title Pt will have improved TUG to 15 sec or < with LRAD to demo improved balance, gait, and functional strength to maximize home and community participation.   Time 3   Period Weeks   Status New           PT Long Term Goals - 08/05/17 1419      PT LONG TERM GOAL #1   Title Pt will have improved MMT in all muscle groups tested by at least 1 grade to maximize gait and balance in order to decrease risk for falls.   Time 6   Period Weeks   Status New   Target Date 09/16/17     PT  LONG  TERM GOAL #2   Title Pt will be able to perform SLS on BLE for at least 10 sec with min to no unsteadiness in order to maximize gait on uneven ground.   Time 6   Period Weeks   Status New     PT LONG TERM GOAL #3   Title Pt will have improved 5xSTS to 25 sec or < to demonstrate improved functional strength and overall balance.    Time 6   Period Weeks   Status New     PT LONG TERM GOAL #4   Title Pt will score 19/24 on DGI to demonstrate improved balance and decreased risk for falls.   Time 6   Period Weeks   Status New                Plan - 08-18-17 1407    Clinical Impression Statement Pt is pleasant 66 YO F who presents to OPPT with c/o bil hip, knee, ankle pain, as well as increasing deficits in strength and balance, resulting in repeated falls. Pt currently presents with deficits in MMT, especially of proximal hip musculature, functional strength, balance, gait, and overall function. Pt also had increased tenderness to palpation of bil glutes, calves, and medial/lateral ankle musculature. Pt scored well below her age-matched norms for TUG and 5xSTS and she was unable to maintain SLS for > 3 sec on either LE, all of which puts pt at increased risk for falls. Pt needs skilled PT intervention to address these deficits in order to maximize her overall balance and strength in order to decrease risk for falls and maximize QOL.   History and Personal Factors relevant to plan of care: DM (uncontrolled), diabetic neuropathy bil feet, HTN, chronicity of issue, motivated and eager to participate with PT   Clinical Presentation Stable   Clinical Presentation due to: MMT, gait, balance, functional strength, TUG, 5xSTS   Clinical Decision Making Low   Rehab Potential Fair   PT Frequency 2x / week   PT Duration 6 weeks   PT Treatment/Interventions ADLs/Self Care Home Management;Aquatic Therapy;Cryotherapy;Electrical Stimulation;Moist Heat;Ultrasound;DME Instruction;Gait training;Stair  training;Functional mobility training;Therapeutic activities;Therapeutic exercise;Balance training;Neuromuscular re-education;Patient/family education;Manual techniques;Dry needling;Energy conservation;Taping   PT Next Visit Plan review goals and HEP; update HEP regularly since pt is limited in approved visits; perform , DGI; stretching, BLE and functional strengthening, balance training, manual to glutes/piriformis/calves/ankle musculature for pain management   PT Home Exercise Plan eval: bridging, supine clams with RTB   Consulted and Agree with Plan of Care Patient      Patient will benefit from skilled therapeutic intervention in order to improve the following deficits and impairments:  Abnormal gait, Decreased activity tolerance, Decreased balance, Decreased endurance, Decreased mobility, Decreased strength, Difficulty walking, Increased muscle spasms, Impaired flexibility, Impaired sensation, Improper body mechanics, Postural dysfunction, Pain  Visit Diagnosis: Unsteadiness on feet - Plan: PT plan of care cert/re-cert  Muscle weakness (generalized) - Plan: PT plan of care cert/re-cert  Repeated falls - Plan: PT plan of care cert/re-cert  Other symptoms and signs involving the musculoskeletal system - Plan: PT plan of care cert/re-cert  Pain in left leg - Plan: PT plan of care cert/re-cert  Pain in right leg - Plan: PT plan of care cert/re-cert      G-Codes - 08/18/17 1424    Functional Assessment Tool Used (Outpatient Only) clinical judgement, MMT, SLS, 5xSTS, TUG   Functional Limitation Mobility: Walking and moving around   Mobility: Walking and Moving Around Current  Status [id number]) At least 60 percent but less than 80 percent impaired, limited or restricted   Mobility: Walking and Moving Around Goal Status [phone]) At least 40 percent but less than 60 percent impaired, limited or restricted       Problem List Patient Active Problem List   Diagnosis Date Noted  . Leg  weakness, bilateral 03/29/2014  . Shoulder weakness 03/29/2014  . Difficulty in walking(719.7) 03/29/2014       Jac Canavan PT, DPT  [facility] [address] [place], [place], [address] Phone: [phone]   Fax:  [phone]  Name: Vicki Murphy MRN: [id number] Date of Birth: 05-17-1951

## 2017-08-05 NOTE — Patient Instructions (Signed)
  BRIDGING  While lying on your back, tighten your lower abdominals, squeeze your buttocks and then raise your buttocks off the floor/bed as creating a "Bridge" with your body. Hold and then lower yourself and repeat. Push through heels, should feel below the belt line  Perform 1x/day, 2-3 sets of 10 reps    Supine clam  Lie on back, knees bent and band around the knees.  Keeping abdominals tight, pull band by spreading knees apart. Hold and relax slowly. Should feel in the side of your hips   Perform 1x/day, 2-3 sets of 10 reps with red band

## 2017-08-10 ENCOUNTER — Telehealth (HOSPITAL_COMMUNITY): Payer: Self-pay | Admitting: *Deleted

## 2017-08-10 NOTE — Telephone Encounter (Signed)
08/10/17  left a message to see if patient can change this appt to 1:45 with Nehemiah Settle - 10/4 appt

## 2017-08-11 ENCOUNTER — Telehealth (HOSPITAL_COMMUNITY): Payer: Self-pay | Admitting: *Deleted

## 2017-08-11 ENCOUNTER — Ambulatory Visit (HOSPITAL_COMMUNITY): Payer: Non-veteran care | Attending: Rehabilitative and Restorative Service Providers"

## 2017-08-11 ENCOUNTER — Telehealth (HOSPITAL_COMMUNITY): Payer: Self-pay

## 2017-08-11 DIAGNOSIS — M6281 Muscle weakness (generalized): Secondary | ICD-10-CM | POA: Insufficient documentation

## 2017-08-11 DIAGNOSIS — R29898 Other symptoms and signs involving the musculoskeletal system: Secondary | ICD-10-CM | POA: Insufficient documentation

## 2017-08-11 DIAGNOSIS — R2681 Unsteadiness on feet: Secondary | ICD-10-CM | POA: Insufficient documentation

## 2017-08-11 DIAGNOSIS — M79604 Pain in right leg: Secondary | ICD-10-CM | POA: Insufficient documentation

## 2017-08-11 DIAGNOSIS — M79605 Pain in left leg: Secondary | ICD-10-CM | POA: Insufficient documentation

## 2017-08-11 DIAGNOSIS — R296 Repeated falls: Secondary | ICD-10-CM | POA: Insufficient documentation

## 2017-08-11 NOTE — Telephone Encounter (Signed)
No show, called and left message concerning missed apt.  Included next apt date and hopeful thatt she will be able to make it at 1:45 rather than 2:30, contact info given.    [address] LPTA; CBIS [phone]

## 2017-08-11 NOTE — Telephone Encounter (Signed)
08/11/17  A friend called on patient's behalf to say that she didn't show up for her appt today because she had taken some medication and didn't wake up in time.

## 2017-08-11 NOTE — Telephone Encounter (Signed)
08/11/17  10/2 friend called and I asked about this and she said patient was asleep and she would have her call us back Wednesday, 10/3

## 2017-08-13 ENCOUNTER — Ambulatory Visit (HOSPITAL_COMMUNITY): Payer: Non-veteran care

## 2017-08-13 ENCOUNTER — Encounter (HOSPITAL_COMMUNITY): Payer: Self-pay

## 2017-08-13 DIAGNOSIS — M79604 Pain in right leg: Secondary | ICD-10-CM

## 2017-08-13 DIAGNOSIS — R29898 Other symptoms and signs involving the musculoskeletal system: Secondary | ICD-10-CM | POA: Diagnosis present

## 2017-08-13 DIAGNOSIS — R2681 Unsteadiness on feet: Secondary | ICD-10-CM | POA: Diagnosis present

## 2017-08-13 DIAGNOSIS — M6281 Muscle weakness (generalized): Secondary | ICD-10-CM | POA: Diagnosis present

## 2017-08-13 DIAGNOSIS — M79605 Pain in left leg: Secondary | ICD-10-CM | POA: Diagnosis present

## 2017-08-13 DIAGNOSIS — R296 Repeated falls: Secondary | ICD-10-CM

## 2017-08-13 NOTE — Therapy (Signed)
[facility] [address] [place], [place], [address] Phone: [phone]   Fax:  [phone]  Physical Therapy Treatment  Patient Details  Name: Vicki Murphy MRN: [id number] Date of Birth: September 14, 1951 Referring Provider: Ronie Spies  Encounter Date: 08/13/2017      PT End of Session - 08/13/17 1350    Visit Number 2   Number of Visits 14   Date for PT Re-Evaluation 08/26/17   Authorization Type VA (14 approved visits including 1 eval, 12 f/u visits, 1 re-eval if indicated)   Authorization Time Period 08/05/17 to 09/16/17   Authorization - Visit Number 2   Authorization - Number of Visits 14   PT Start Time 1346   PT Stop Time 1426   PT Time Calculation (min) 40 min   Activity Tolerance Patient tolerated treatment well   Behavior During Therapy [facility] for tasks assessed/performed      Past Medical History:  Diagnosis Date  . Arthritis   . Diabetes mellitus   . Hypertension     Past Surgical History:  Procedure Laterality Date  . BREAST SURGERY     bio-psy negative  . CARPAL TUNNEL RELEASE     right  . JOINT REPLACEMENT  1992 and 2001   right knee    There were no vitals filed for this visit.      Subjective Assessment - 08/13/17 1350    Subjective Pt states that she is feeling a pulling in her R calf, her R knee is also hurting. She states that she fell Tuesday when her knee popped and it gave way and she landed on her hip.   Limitations Walking;House hold activities;Lifting   How long can you sit comfortably? no issues with sitting, hurts to stand up   How long can you stand comfortably? not long   How long can you walk comfortably? not long   Patient Stated Goals to get balance better   Currently in Pain? Yes   Pain Score 6   per VAS scale   Pain Location Leg   Pain Orientation Right  whole R leg   Pain Descriptors / Indicators Aching;Dull   Pain Type Chronic pain   Pain Onset More than a month ago   Pain  Frequency Constant   Aggravating Factors  anything on her feet   Pain Relieving Factors rest, TENS unit   Effect of Pain on Daily Activities increases            [facility] PT Assessment - 08/13/17 0001      Ambulation/Gait   Ambulation Distance (Feet) 190 Feet    Assistive device Straight cane   Gait Pattern Step-through pattern;Decreased arm swing - right;Decreased arm swing - left;Decreased stride length;Decreased hip/knee flexion - right;Decreased hip/knee flexion - left;Antalgic;Trendelenburg  intermittent staggering throughout test   Gait velocity 0.86m/s     Standardized Balance Assessment   Standardized Balance Assessment Dynamic Gait Index     Dynamic Gait Index   Level Surface Mild Impairment   Change in Gait Speed Mild Impairment   Gait with Horizontal Head Turns Mild Impairment   Gait with Vertical Head Turns Moderate Impairment   Gait and Pivot Turn Mild Impairment   Step Over Obstacle Moderate Impairment   Step Around Obstacles Mild Impairment   Steps Moderate Impairment   Total Score 13              [facility] Adult PT Treatment/Exercise - 08/13/17 0001  Exercises   Exercises Knee/Hip     Knee/Hip Exercises: Stretches   Piriformis Stretch Both;2 reps;30 seconds   Gastroc Stretch Right;2 reps;30 seconds   Gastroc Stretch Limitations supine with sheet     Knee/Hip Exercises: Supine   Bridges 10 reps              PT Education - 08/13/17 1353    Education provided Yes   Education Details exercise technique   Person(s) Educated Patient   Methods Explanation;Demonstration   Comprehension Verbalized understanding;Returned demonstration          PT Short Term Goals - 08/05/17 1417      PT SHORT TERM GOAL #1   Title Pt will be independent with HEP and perform consistently to maximize return to PLOF.    Time 3   Period Weeks   Status New   Target Date 08/26/17     PT SHORT TERM GOAL #2   Title Pt will have improved MMT in all  muscle groups tested by at least 1/2 grade to maximize gait and balance.   Time 3   Period Weeks   Status New     PT SHORT TERM GOAL #3   Title Pt will have improved TUG to 15 sec or < with LRAD to demo improved balance, gait, and functional strength to maximize home and community participation.   Time 3   Period Weeks   Status New           PT Long Term Goals - 08/05/17 1419      PT LONG TERM GOAL #1   Title Pt will have improved MMT in all muscle groups tested by at least 1 grade to maximize gait and balance in order to decrease risk for falls.   Time 6   Period Weeks   Status New   Target Date 09/16/17     PT LONG TERM GOAL #2   Title Pt will be able to perform SLS on BLE for at least 10 sec with min to no unsteadiness in order to maximize gait on uneven ground.   Time 6   Period Weeks   Status New     PT LONG TERM GOAL #3   Title Pt will have improved 5xSTS to 25 sec or < to demonstrate improved functional strength and overall balance.    Time 6   Period Weeks   Status New     PT LONG TERM GOAL #4   Title Pt will score 19/24 on DGI to demonstrate improved balance and decreased risk for falls.   Time 6   Period Weeks   Status New               Plan - 08/13/17 1427    Clinical Impression Statement Began session by reviewing initial goals with no f/u questions. Performed and DGI with pt; she scored 13/24 on DGI indicating she is at increased risk for falls, and pt amb 132ft at 0.13m/s during the . Ended session with therex which pt tolerated well. Continue POC as planned.   Rehab Potential Fair   PT Frequency 2x / week   PT Duration 6 weeks   PT Treatment/Interventions ADLs/Self Care Home Management;Aquatic Therapy;Cryotherapy;Electrical Stimulation;Moist Heat;Ultrasound;DME Instruction;Gait training;Stair training;Functional mobility training;Therapeutic activities;Therapeutic exercise;Balance training;Neuromuscular re-education;Patient/family  education;Manual techniques;Dry needling;Energy conservation;Taping   PT Next Visit Plan update HEP regularly since pt is limited in approved visits; continue stretching, BLE and functional strengthening, balance training, manual to glutes/piriformis/calves/ankle musculature for  pain management   PT Home Exercise Plan eval: bridging, supine clams with RTB   Consulted and Agree with Plan of Care Patient      Patient will benefit from skilled therapeutic intervention in order to improve the following deficits and impairments:  Abnormal gait, Decreased activity tolerance, Decreased balance, Decreased endurance, Decreased mobility, Decreased strength, Difficulty walking, Increased muscle spasms, Impaired flexibility, Impaired sensation, Improper body mechanics, Postural dysfunction, Pain  Visit Diagnosis: Unsteadiness on feet  Muscle weakness (generalized)  Repeated falls  Other symptoms and signs involving the musculoskeletal system  Pain in left leg  Pain in right leg     Problem List Patient Active Problem List   Diagnosis Date Noted  . Leg weakness, bilateral 03/29/2014  . Shoulder weakness 03/29/2014  . Difficulty in walking(719.7) 03/29/2014       Jac Canavan PT, DPT  [facility] [address] [place], [place], [address] Phone: [phone]   Fax:  [phone]  Name: Vicki Murphy MRN: [id number] Date of Birth: 10/10/1951

## 2017-08-18 ENCOUNTER — Ambulatory Visit (HOSPITAL_COMMUNITY): Payer: Non-veteran care

## 2017-08-18 ENCOUNTER — Encounter (HOSPITAL_COMMUNITY): Payer: Self-pay

## 2017-08-18 DIAGNOSIS — R2681 Unsteadiness on feet: Secondary | ICD-10-CM

## 2017-08-18 DIAGNOSIS — M79604 Pain in right leg: Secondary | ICD-10-CM

## 2017-08-18 DIAGNOSIS — R296 Repeated falls: Secondary | ICD-10-CM

## 2017-08-18 DIAGNOSIS — R29898 Other symptoms and signs involving the musculoskeletal system: Secondary | ICD-10-CM

## 2017-08-18 DIAGNOSIS — M6281 Muscle weakness (generalized): Secondary | ICD-10-CM

## 2017-08-18 DIAGNOSIS — M79605 Pain in left leg: Secondary | ICD-10-CM

## 2017-08-18 NOTE — Therapy (Signed)
[facility] [address] [place], [place], [address] Phone: [phone]   Fax:  [phone]  Physical Therapy Treatment  Patient Details  Name: Vicki Murphy MRN: [id number] Date of Birth: 1951-02-24 Referring Provider: Ronie Spies  Encounter Date: 08/18/2017      PT End of Session - 08/18/17 1441    Visit Number 3   Number of Visits 14   Date for PT Re-Evaluation 08/26/17   [facility] Type [facility] (14 approved visits including 1 eval, 12 f/u visits, 1 re-eval if indicated)   Authorization Time Period 08/05/17 to 09/16/17   Authorization - Visit Number 3   Authorization - Number of Visits 14   PT Start Time 1440   PT Stop Time 1519   PT Time Calculation (min) 39 min   Activity Tolerance Patient tolerated treatment well   Behavior During Therapy [facility] for tasks assessed/performed      Past Medical History:  Diagnosis Date  . Arthritis   . Diabetes mellitus   . Hypertension     Past Surgical History:  Procedure Laterality Date  . BREAST SURGERY     bio-psy negative  . CARPAL TUNNEL RELEASE     right  . JOINT REPLACEMENT  1992 and 2001   right knee    There were no vitals filed for this visit.      Subjective Assessment - 08/18/17 1441    Subjective Pt states that some things are better and some things are worse.    Limitations Walking;House hold activities;Lifting   How long can you sit comfortably? no issues with sitting, hurts to stand up   How long can you stand comfortably? not long   How long can you walk comfortably? not long   Patient Stated Goals to get balance better   Currently in Pain? Yes   Pain Score 8    Pain Location Leg   Pain Orientation Right   Pain Descriptors / Indicators Aching;Dull   Pain Onset More than a month ago   Pain Frequency Constant   Aggravating Factors  anything on her feet   Pain Relieving Factors rest, TENS unit   Effect of Pain on Daily Activities increases                [facility] Adult PT Treatment/Exercise - 08/18/17 0001      Knee/Hip Exercises: Stretches   Gastroc Stretch Both;2 reps;30 seconds   Gastroc Stretch Limitations supine with sheet     Knee/Hip Exercises: Standing   Heel Raises Both;10 reps   Heel Raises Limitations heel and toe   Hip Abduction Both;10 reps   Abduction Limitations RTB   Forward Step Up Both;10 reps;Hand Hold: 2;Step Height: 4"   Other Standing Knee Exercises bil tandem stance 1x10" each (too easy) and with UE flexion x10 reps each with 1# wt bar     Knee/Hip Exercises: Seated   Sit to Sand 10 reps;with UE support  RTB around knees     Manual Therapy   Manual Therapy Soft tissue mobilization   Manual therapy comments completed separate rest of treatment   Soft tissue mobilization efflurage to surrounding musculature of bil ankles               PT Short Term Goals - 08/05/17 1417      PT SHORT TERM GOAL #1   Title Pt will be independent with HEP and perform consistently to maximize return to PLOF.    Time 3  Period Weeks   Status New   Target Date 08/26/17     PT SHORT TERM GOAL #2   Title Pt will have improved MMT in all muscle groups tested by at least 1/2 grade to maximize gait and balance.   Time 3   Period Weeks   Status New     PT SHORT TERM GOAL #3   Title Pt will have improved TUG to 15 sec or < with LRAD to demo improved balance, gait, and functional strength to maximize home and community participation.   Time 3   Period Weeks   Status New           PT Long Term Goals - 08/05/17 1419      PT LONG TERM GOAL #1   Title Pt will have improved MMT in all muscle groups tested by at least 1 grade to maximize gait and balance in order to decrease risk for falls.   Time 6   Period Weeks   Status New   Target Date 09/16/17     PT LONG TERM GOAL #2   Title Pt will be able to perform SLS on BLE for at least 10 sec with min to no unsteadiness in order to maximize gait on  uneven ground.   Time 6   Period Weeks   Status New     PT LONG TERM GOAL #3   Title Pt will have improved 5xSTS to 25 sec or < to demonstrate improved functional strength and overall balance.    Time 6   Period Weeks   Status New     PT LONG TERM GOAL #4   Title Pt will score 19/24 on DGI to demonstrate improved balance and decreased risk for falls.   Time 6   Period Weeks   Status New               Plan - 08/18/17 1621    Clinical Impression Statement Began session with manual to surrounding musculature of bil ankles with reports of decreased pain. Rest of session focused on functional strengthening and balance. Pt did not report any pain or show any signs of discomfort during session. She reported 7/10 pain at EOS, slightly lower thatn the 8/10 at the beginning. Continue POC as planned, progressing as tolerated.   Rehab Potential Fair   PT Frequency 2x / week   PT Duration 6 weeks   PT Treatment/Interventions ADLs/Self Care Home Management;Aquatic Therapy;Cryotherapy;Electrical Stimulation;Moist Heat;Ultrasound;DME Instruction;Gait training;Stair training;Functional mobility training;Therapeutic activities;Therapeutic exercise;Balance training;Neuromuscular re-education;Patient/family education;Manual techniques;Dry needling;Energy conservation;Taping   PT Next Visit Plan issue copy of 10/9 HEP additions as this PT forgot to issue to pt at EOS; update HEP regularly since pt is limited in approved visits; continue BLE and functional strengthening, balance training, manual to glutes/piriformis/calves/ankle musculature for pain management; add rockerboard, side step over hurdle, side stepping, and standing marching   PT Home Exercise Plan eval: bridging, supine clams with RTB; 10/9: sit <> stand with band, standing hip abd   Consulted and Agree with Plan of Care Patient      Patient will benefit from skilled therapeutic intervention in order to improve the following deficits and  impairments:  Abnormal gait, Decreased activity tolerance, Decreased balance, Decreased endurance, Decreased mobility, Decreased strength, Difficulty walking, Increased muscle spasms, Impaired flexibility, Impaired sensation, Improper body mechanics, Postural dysfunction, Pain  Visit Diagnosis: Unsteadiness on feet  Muscle weakness (generalized)  Repeated falls  Other symptoms and signs involving the musculoskeletal system  Pain in left leg  Pain in right leg     Problem List Patient Active Problem List   Diagnosis Date Noted  . Leg weakness, bilateral 03/29/2014  . Shoulder weakness 03/29/2014  . Difficulty in walking(719.7) 03/29/2014       Jac Canavan PT, DPT  [facility] [address] [place], [place], [address] Phone: [phone]   Fax:  [phone]  Name: Vicki Murphy MRN: [id number] Date of Birth: [birth year]

## 2017-08-18 NOTE — Patient Instructions (Signed)
  SIT TO STAND - NO SUPPORT  Start by scooting close to the front of the chair.  Next, lean forward at your trunk and reach forward with your arms and rise to standing without using your hands to push off from the chair or other object.   Use your arms as a counter-balance by reaching forward when in sitting and lower them as you approach standing.   Perform 1x/day, 2-3 sets of 10 reps   LOOPED ELASTIC BAND HIP ABDUCTION  While standing with an elastic band looped around your ankles, move the target leg out to the side as shown.   Perform 1x/day, 2-3 sets of 10 reps with red band on each leg

## 2017-08-20 ENCOUNTER — Encounter (HOSPITAL_COMMUNITY): Payer: Non-veteran care

## 2017-08-25 ENCOUNTER — Encounter (HOSPITAL_COMMUNITY): Payer: Non-veteran care

## 2017-08-27 ENCOUNTER — Ambulatory Visit (HOSPITAL_COMMUNITY): Payer: Non-veteran care

## 2017-08-27 ENCOUNTER — Telehealth (HOSPITAL_COMMUNITY): Payer: Self-pay | Admitting: Internal Medicine

## 2017-08-27 NOTE — Telephone Encounter (Signed)
08/27/17  pt left a message that she fell yesterday and wouldn't be coming to therapy today

## 2017-09-01 ENCOUNTER — Ambulatory Visit (HOSPITAL_COMMUNITY): Payer: Non-veteran care

## 2017-09-01 ENCOUNTER — Telehealth (HOSPITAL_COMMUNITY): Payer: Self-pay | Admitting: Internal Medicine

## 2017-09-01 NOTE — Telephone Encounter (Signed)
09/01/17  call[facility] and wouldn't be here today

## 2017-09-03 ENCOUNTER — Ambulatory Visit (HOSPITAL_COMMUNITY): Payer: Non-veteran care

## 2017-09-03 ENCOUNTER — Telehealth (HOSPITAL_COMMUNITY): Payer: Self-pay

## 2017-09-03 NOTE — Telephone Encounter (Signed)
[facility] fell [facility] if to continue this PT or not. [facility].

## 2017-09-08 ENCOUNTER — Ambulatory Visit (HOSPITAL_COMMUNITY): Payer: Non-veteran care

## 2017-09-08 ENCOUNTER — Telehealth (HOSPITAL_COMMUNITY): Payer: Self-pay | Admitting: Internal Medicine

## 2017-09-08 NOTE — Telephone Encounter (Signed)
09/08/17  wants to get moved... today becuase [place] is sick [place]

## 2017-09-10 ENCOUNTER — Ambulatory Visit (HOSPITAL_COMMUNITY): Payer: Non-veteran care

## 2017-09-11 ENCOUNTER — Telehealth (HOSPITAL_COMMUNITY): Payer: Self-pay | Admitting: Internal Medicine

## 2017-09-11 NOTE — Telephone Encounter (Signed)
09/11/17  Left a messge offering two appts for this afternoon - patient is on the wait list

## 2017-09-15 ENCOUNTER — Telehealth (HOSPITAL_COMMUNITY): Payer: Self-pay

## 2017-09-15 ENCOUNTER — Encounter (HOSPITAL_COMMUNITY): Payer: Self-pay

## 2017-09-15 ENCOUNTER — Ambulatory Visit (HOSPITAL_COMMUNITY): Payer: Non-veteran care

## 2017-09-15 NOTE — Therapy (Signed)
[place], [place], [address] Phone: [phone]   Fax:  [phone]  Patient Details  Name: Vicki Murphy MRN: [id number] Date of Birth: Mar 22, 1951 Referring Provider:  No ref. provider found  Encounter Date: 09/15/2017    PHYSICAL THERAPY DISCHARGE SUMMARY  Visits from Start of Care: 3  Current functional level related to goals / functional outcomes: See last treatment note.   Remaining deficits: See last treatment note   Education / Equipment: n/a Plan: Patient agrees to discharge.  Patient goals were not met. Patient is being discharged due to the patient's request.  ?????       The pt called on 09/15/17 asking to be d/c'd since she has fallen multiple times on hip and [facility] by phone [facility] to be d/c. [facility] will send another referral if needed.      Geraldine Solar PT, [place] [address] [place], [place], [address] Phone: [phone]   Fax:  [phone]

## 2017-09-15 NOTE — Telephone Encounter (Signed)
Pt requested to be d/c - has had several falls on hip and [facility] by [facility] for [facility] to be [facility]. [facility] will send another referral if needed.

## 2017-09-17 ENCOUNTER — Encounter (HOSPITAL_COMMUNITY): Payer: Non-veteran care | Admitting: Physical Therapy

## 2017-09-22 ENCOUNTER — Encounter (HOSPITAL_COMMUNITY): Payer: Non-veteran care

## 2017-09-24 ENCOUNTER — Encounter (HOSPITAL_COMMUNITY): Payer: Non-veteran care

## 2020-05-24 ENCOUNTER — Ambulatory Visit (INDEPENDENT_AMBULATORY_CARE_PROVIDER_SITE_OTHER): Payer: No Typology Code available for payment source

## 2020-05-24 ENCOUNTER — Encounter: Payer: No Typology Code available for payment source | Admitting: Podiatry

## 2020-05-24 DIAGNOSIS — G629 Polyneuropathy, unspecified: Secondary | ICD-10-CM

## 2020-06-03 NOTE — Progress Notes (Signed)
Pt cancelled

## 2020-06-04 ENCOUNTER — Ambulatory Visit (INDEPENDENT_AMBULATORY_CARE_PROVIDER_SITE_OTHER): Payer: No Typology Code available for payment source

## 2020-06-04 ENCOUNTER — Other Ambulatory Visit: Payer: Self-pay

## 2020-06-04 ENCOUNTER — Ambulatory Visit (INDEPENDENT_AMBULATORY_CARE_PROVIDER_SITE_OTHER): Payer: No Typology Code available for payment source | Admitting: Podiatry

## 2020-06-04 DIAGNOSIS — R202 Paresthesia of skin: Secondary | ICD-10-CM

## 2020-06-04 DIAGNOSIS — R200 Anesthesia of skin: Secondary | ICD-10-CM

## 2020-06-04 DIAGNOSIS — I739 Peripheral vascular disease, unspecified: Secondary | ICD-10-CM | POA: Diagnosis not present

## 2020-06-04 DIAGNOSIS — M79672 Pain in left foot: Secondary | ICD-10-CM

## 2020-06-04 DIAGNOSIS — M79671 Pain in right foot: Secondary | ICD-10-CM | POA: Diagnosis not present

## 2020-06-04 DIAGNOSIS — G629 Polyneuropathy, unspecified: Secondary | ICD-10-CM | POA: Diagnosis not present

## 2020-06-04 MED ORDER — GABAPENTIN 600 MG PO TABS: 600.0000 mg | ORAL_TABLET | Freq: Three times a day (TID) | ORAL | 2 refills | Status: DC

## 2020-06-04 NOTE — Patient Instructions (Signed)

## 2020-06-07 ENCOUNTER — Other Ambulatory Visit: Payer: Self-pay | Admitting: Podiatry

## 2020-06-08 LAB — COMPLETE METABOLIC PANEL WITH GFR
AG Ratio: 1.4 (calc) (ref 1.0–2.5)
ALT: 9 U/L (ref 6–29)
AST: 11 U/L (ref 10–35)
Albumin: 3.9 g/dL (ref 3.6–5.1)
Alkaline phosphatase (APISO): 69 U/L (ref 37–153)
BUN/Creatinine Ratio: 28 (calc) — ABNORMAL HIGH (ref 6–22)
BUN: 26 mg/dL — ABNORMAL HIGH (ref 7–25)
CO2: 28 mmol/L (ref 20–32)
Calcium: 10.5 mg/dL — ABNORMAL HIGH (ref 8.6–10.4)
Chloride: 101 mmol/L (ref 98–110)
Creat: 0.92 mg/dL (ref 0.50–0.99)
GFR, Est African American: 74 mL/min/{1.73_m2} (ref >=60)
GFR, Est Non African American: 64 mL/min/{1.73_m2} (ref >=60)
Globulin: 2.7 g/dL (calc) (ref 1.9–3.7)
Glucose, Bld: 182 mg/dL — ABNORMAL HIGH (ref 65–139)
Potassium: 3.9 mmol/L (ref 3.5–5.3)
Sodium: 138 mmol/L (ref 135–146)
Total Bilirubin: 0.4 mg/dL (ref 0.2–1.2)
Total Protein: 6.6 g/dL (ref 6.1–8.1)

## 2020-06-08 LAB — HEMOGLOBIN A1C
Hgb A1c MFr Bld: 6.9 % of total Hgb — ABNORMAL HIGH (ref <5.7)
Mean Plasma Glucose: 151 (calc)
eAG (mmol/L): 8.4 (calc)

## 2020-06-08 LAB — VITAMIN B12: Vitamin B-12: 370 pg/mL (ref 200–1100)

## 2020-06-08 LAB — ANA: Anti Nuclear Antibody (ANA): NEGATIVE

## 2020-06-10 NOTE — Progress Notes (Signed)
Subjective:   Patient ID: Vicki Murphy, female   DOB: 69 y.o.   MRN: [id number]   HPI 69 year old female presents the office with a concerns of numbness to her feet as well as "coldness".  This has been ongoing now for some time she is tingling walking to her toes mostly her big toe.  She denies any recent injury.  She does not have back pain.  She does go to the [place] and she has been treated for this and she is currently on gabapentin have not seen much improvement.   Review of Systems  All other systems reviewed and are negative.  Past Medical History:  Diagnosis Date  . Arthritis   . Diabetes mellitus   . Hypertension     Past Surgical History:  Procedure Laterality Date  . BREAST SURGERY     bio-psy negative  . CARPAL TUNNEL RELEASE     right  . JOINT REPLACEMENT  1992 and 2001   right knee     Current Outpatient Medications:  .  aspirin 81 MG tablet, Take 81 mg by mouth daily., Disp: , Rfl:  .  gabapentin (NEURONTIN) 300 MG capsule, Take 300 mg by mouth 3 (three) times daily. , Disp: , Rfl:  .  gabapentin (NEURONTIN) 600 MG tablet, Take 1 tablet (600 mg total) by mouth 3 (three) times daily., Disp: 90 tablet, Rfl: 2 .  hydrochlorothiazide (HYDRODIURIL) 25 MG tablet, Take 25 mg by mouth daily., Disp: , Rfl:  .  metFORMIN (GLUCOPHAGE) 1000 MG tablet, Take 1,000 mg by mouth 3 (three) times daily between meals. , Disp: , Rfl:  .  methadone (DOLOPHINE) 5 MG tablet, Take 5 mg by mouth every 8 (eight) hours. Back pain , Disp: , Rfl:   No Known Allergies       Objective:  Physical Exam  General: AAO x3, NAD  Dermatological: Skin is warm, dry and supple bilateral. There are no open sores, no preulcerative lesions, no rash or signs of infection present.  Vascular: Dorsalis Pedis artery and Posterior Tibial artery pedal pulses are 2/4 bilateral with immedate capillary fill time.  There is no pain with calf compression, swelling, warmth, erythema.   Neruologic: Sensation  decreased with Semmes Weinstein monofilament, decreased vibratory sensation. Negative tinel sign.   Musculoskeletal: No area pinpoint tenderness identified.  Muscular strength 5/5 in all groups tested bilateral.  Gait: Unassisted, Nonantalgic.      Assessment:   69 year old female with neuropathy     Plan:  -Treatment options discussed including all alternatives, risks, and complications -Etiology of symptoms were discussed -X-rays obtained reviewed.  Bunion present.  No evidence of acute fracture -ABI was performed in the office.  Left was 1.1 right was 1.04 -I do think her symptoms are mostly from neuropathy.  We will increase her dose of gabapentin 600 mg 3 times a day.  I also ordered blood work to check a complete metabolic panel, ANA, B12, A1c.  Also recommend follow-up with her primary care physician in regards to back pain which could be contributing to symptoms as well.  Vivi Barrack DPM

## 2020-06-15 ENCOUNTER — Telehealth: Payer: Self-pay | Admitting: *Deleted

## 2020-06-15 NOTE — Telephone Encounter (Signed)
Left message for pt to call for results  

## 2020-06-15 NOTE — Telephone Encounter (Signed)
-----   Message from Vivi Barrack, DPM sent at 06/13/2020 11:58 AM EDT ----- Val- please let her know that her a1c was 6.9 but otherwise normal.

## 2020-06-18 NOTE — Telephone Encounter (Signed)
Left message on pt's home phone to call for results. 

## 2020-06-18 NOTE — Telephone Encounter (Signed)
Unable to leave message on pt's work phone message states invalid or non-working number. Mailed letter informing pt of Dr. Gabriel Rung review of results 06/07/2020.

## 2020-06-22 ENCOUNTER — Telehealth: Payer: Self-pay | Admitting: *Deleted

## 2020-06-22 MED ORDER — GABAPENTIN 300 MG PO CAPS: 300.0000 mg | ORAL_CAPSULE | Freq: Three times a day (TID) | ORAL | 0 refills | Status: DC

## 2020-06-22 NOTE — Telephone Encounter (Signed)
Dr. Ardelle Anton states go back to the Gabapentin 300mg  3 times a day. Unable to leave a message the voicemail box is full.

## 2020-06-22 NOTE — Telephone Encounter (Signed)
Pt states she is having difficulty talking and falling around after the change of gabapentin.

## 2020-06-22 NOTE — Telephone Encounter (Signed)
Left message for pt to stop the gabapentin dosage fo 06/04/2020 and I have sent a message to Dr. Ardelle Anton.

## 2020-07-12 NOTE — Addendum Note (Signed)
Addended by: Hadley Pen R on: 07/12/2020 09:09 AM   Modules accepted: Orders

## 2020-07-19 ENCOUNTER — Ambulatory Visit: Payer: No Typology Code available for payment source | Admitting: Podiatry

## 2020-07-31 ENCOUNTER — Ambulatory Visit: Payer: No Typology Code available for payment source | Admitting: Podiatry

## 2020-08-14 ENCOUNTER — Other Ambulatory Visit: Payer: Self-pay

## 2020-08-14 ENCOUNTER — Ambulatory Visit (INDEPENDENT_AMBULATORY_CARE_PROVIDER_SITE_OTHER): Payer: No Typology Code available for payment source | Admitting: Podiatry

## 2020-08-14 DIAGNOSIS — M79672 Pain in left foot: Secondary | ICD-10-CM | POA: Diagnosis not present

## 2020-08-14 DIAGNOSIS — B351 Tinea unguium: Secondary | ICD-10-CM | POA: Diagnosis not present

## 2020-08-14 DIAGNOSIS — G629 Polyneuropathy, unspecified: Secondary | ICD-10-CM

## 2020-08-14 DIAGNOSIS — M79671 Pain in right foot: Secondary | ICD-10-CM

## 2020-08-16 NOTE — Progress Notes (Signed)
Subjective: 69 year old female presents the office today for follow-up evaluation of neuropathy but also for painful, thick toenails she cannot trim her self.  She denies any open sores.  She is currently on gabapentin we try to increase the dose but she could not tolerate this.  Still has burning, cold sensations to her feet.  Aggravated.  She has no new concerns today. Denies any systemic complaints such as fevers, chills, nausea, vomiting. No acute changes since last appointment, and no other complaints at this time.   Objective: AAO x3, NAD DP/PT pulses palpable bilaterally, CRT less than 3 seconds Sensation decreased with Semmes Weinstein monofilament. Nails are hypertrophic, dystrophic, brittle, discolored, elongated 10. No surrounding redness or drainage. Tenderness nails 1-5 bilaterally. No open lesions or pre-ulcerative lesions are identified today. No pain with calf compression, swelling, warmth, erythema  Assessment: 69 year old female with neuropathy, symptomatic onychomycosis  Plan: -All treatment options discussed with the patient including all alternatives, risks, complications.  -Debrided nails x10 without any complications or bleeding -We will discuss with her primary potentially changing to Lyrica as she could not tolerate increased dose of gabapentin. -Patient encouraged to call the office with any questions, concerns, change in symptoms.   Vivi Barrack DPM

## 2020-11-15 ENCOUNTER — Ambulatory Visit: Payer: No Typology Code available for payment source | Admitting: Podiatry

## 2021-09-04 ENCOUNTER — Encounter: Payer: Self-pay | Admitting: Emergency Medicine

## 2021-09-04 ENCOUNTER — Other Ambulatory Visit: Payer: Self-pay

## 2021-09-04 ENCOUNTER — Ambulatory Visit
Admission: EM | Admit: 2021-09-04 | Discharge: 2021-09-04 | Disposition: A | Discharge status: Home / Self Care | Payer: Non-veteran care | Attending: Family Medicine | Admitting: Family Medicine

## 2021-09-04 DIAGNOSIS — L918 Other hypertrophic disorders of the skin: Secondary | ICD-10-CM | POA: Diagnosis not present

## 2021-09-04 NOTE — ED Triage Notes (Signed)
Boil on side of right inner thigh.  Pain since Sunday.

## 2021-09-05 NOTE — ED Provider Notes (Signed)
  [facility]   [id number] 09/04/21 Arrival Time: 1849  ASSESSMENT & PLAN:  1. Inflamed skin tag    Deferred procedure for removal here as I have no way to send to pathology for definitive dx. Discussed. No signs of infection. No bleeding.  She will schedule f/u with PCP at [facility] for removal.  Reviewed expectations re: course of current medical issues. Questions answered. Outlined signs and symptoms indicating need for more acute intervention. Patient verbalized understanding. After Visit Summary given.   SUBJECTIVE:  Vicki Murphy is a 70 y.o. female who presents with a skin complaint. Inner R thigh/perineum. Mass present x 3 months. Irritated. Painful at times. No bleeding. No prev evaluation.    OBJECTIVE: Vitals:   09/04/21 1857  BP: 128/76  Pulse: 81  Resp: 18  Temp: 98.3 F (36.8 C)  TempSrc: Oral  SpO2: 94%    General appearance: alert; no distress HEENT: [place]; AT Neck: supple with FROM Extremities: no edema; moves all extremities normally Skin: warm and dry; signs of infection: no; what appears to be an inflamed and enlarged skin tag present on R inner superior thigh at perineum; approx 1cm in size; no bleeding Psychological: alert and cooperative; normal mood and affect  Allergies  Allergen Reactions   Morphine And Related    Penicillins    Tetanus-Diphtheria Toxoids Td     Past Medical History:  Diagnosis Date   Arthritis    Diabetes mellitus    Hypertension    Social History   Socioeconomic History   Marital status: Legally Separated    Spouse name: Not on file   Number of children: Not on file   Years of education: Not on file   Highest education level: Not on file  Occupational History   Not on file  Tobacco Use   Smoking status: Every Day    Types: Cigarettes   Smokeless tobacco: Never  Substance and Sexual Activity   Alcohol use: No   Drug use: No   Sexual activity: Not on file  Other Topics Concern   Not on file   Social History Narrative   Not on file   Social Determinants of Health   Financial Resource Strain: Not on file  Food Insecurity: Not on file  Transportation Needs: Not on file  Physical Activity: Not on file  Stress: Not on file  Social Connections: Not on file  Intimate Partner Violence: Not on file   History reviewed. No pertinent family history. Past Surgical History:  Procedure Laterality Date   BREAST SURGERY     bio-psy negative   CARPAL TUNNEL RELEASE     right   JOINT REPLACEMENT  1992 and 2001   right knee      Mardella Layman, MD 09/05/21 1331

## 2021-11-18 ENCOUNTER — Ambulatory Visit: Payer: No Typology Code available for payment source | Admitting: Podiatry

## 2023-10-01 DIAGNOSIS — S199XXA Unspecified injury of neck, initial encounter: Secondary | ICD-10-CM | POA: Diagnosis not present

## 2023-10-01 DIAGNOSIS — S0990XA Unspecified injury of head, initial encounter: Secondary | ICD-10-CM | POA: Diagnosis not present

## 2023-10-01 DIAGNOSIS — R519 Headache, unspecified: Secondary | ICD-10-CM | POA: Diagnosis not present

## 2023-10-01 DIAGNOSIS — G009 Bacterial meningitis, unspecified: Secondary | ICD-10-CM | POA: Diagnosis not present

## 2023-10-01 DIAGNOSIS — J019 Acute sinusitis, unspecified: Secondary | ICD-10-CM | POA: Diagnosis not present

## 2023-10-01 DIAGNOSIS — I609 Nontraumatic subarachnoid hemorrhage, unspecified: Secondary | ICD-10-CM | POA: Diagnosis not present

## 2023-10-01 DIAGNOSIS — G43009 Migraine without aura, not intractable, without status migrainosus: Secondary | ICD-10-CM | POA: Diagnosis not present

## 2023-10-01 DIAGNOSIS — G43109 Migraine with aura, not intractable, without status migrainosus: Secondary | ICD-10-CM | POA: Diagnosis not present

## 2023-11-26 ENCOUNTER — Encounter (HOSPITAL_COMMUNITY): Payer: Non-veteran care

## 2023-11-26 ENCOUNTER — Inpatient Hospital Stay (HOSPITAL_COMMUNITY): Payer: No Typology Code available for payment source

## 2023-11-26 ENCOUNTER — Emergency Department (HOSPITAL_COMMUNITY): Payer: No Typology Code available for payment source

## 2023-11-26 ENCOUNTER — Encounter (HOSPITAL_COMMUNITY): Payer: Self-pay

## 2023-11-26 ENCOUNTER — Inpatient Hospital Stay (HOSPITAL_COMMUNITY)
Admission: EM | Admit: 2023-11-26 | Discharge: 2023-12-12 | DRG: 871 | Disposition: E | Payer: No Typology Code available for payment source | Attending: Pulmonary Disease | Admitting: Pulmonary Disease

## 2023-11-26 ENCOUNTER — Other Ambulatory Visit: Payer: Self-pay

## 2023-11-26 DIAGNOSIS — E871 Hypo-osmolality and hyponatremia: Secondary | ICD-10-CM | POA: Diagnosis present

## 2023-11-26 DIAGNOSIS — Z7984 Long term (current) use of oral hypoglycemic drugs: Secondary | ICD-10-CM

## 2023-11-26 DIAGNOSIS — I469 Cardiac arrest, cause unspecified: Secondary | ICD-10-CM | POA: Diagnosis present

## 2023-11-26 DIAGNOSIS — A419 Sepsis, unspecified organism: Secondary | ICD-10-CM | POA: Diagnosis present

## 2023-11-26 DIAGNOSIS — Z880 Allergy status to penicillin: Secondary | ICD-10-CM

## 2023-11-26 DIAGNOSIS — Z66 Do not resuscitate: Secondary | ICD-10-CM | POA: Diagnosis not present

## 2023-11-26 DIAGNOSIS — R569 Unspecified convulsions: Secondary | ICD-10-CM | POA: Diagnosis not present

## 2023-11-26 DIAGNOSIS — R6521 Severe sepsis with septic shock: Secondary | ICD-10-CM | POA: Diagnosis present

## 2023-11-26 DIAGNOSIS — Z96651 Presence of right artificial knee joint: Secondary | ICD-10-CM | POA: Diagnosis present

## 2023-11-26 DIAGNOSIS — N390 Urinary tract infection, site not specified: Secondary | ICD-10-CM | POA: Diagnosis present

## 2023-11-26 DIAGNOSIS — F4310 Post-traumatic stress disorder, unspecified: Secondary | ICD-10-CM | POA: Diagnosis present

## 2023-11-26 DIAGNOSIS — N170 Acute kidney failure with tubular necrosis: Secondary | ICD-10-CM | POA: Diagnosis present

## 2023-11-26 DIAGNOSIS — B961 Klebsiella pneumoniae [K. pneumoniae] as the cause of diseases classified elsewhere: Secondary | ICD-10-CM | POA: Diagnosis present

## 2023-11-26 DIAGNOSIS — I468 Cardiac arrest due to other underlying condition: Secondary | ICD-10-CM | POA: Diagnosis present

## 2023-11-26 DIAGNOSIS — Z515 Encounter for palliative care: Secondary | ICD-10-CM | POA: Diagnosis not present

## 2023-11-26 DIAGNOSIS — Z1611 Resistance to penicillins: Secondary | ICD-10-CM | POA: Diagnosis present

## 2023-11-26 DIAGNOSIS — J9601 Acute respiratory failure with hypoxia: Secondary | ICD-10-CM | POA: Diagnosis present

## 2023-11-26 DIAGNOSIS — Z7951 Long term (current) use of inhaled steroids: Secondary | ICD-10-CM

## 2023-11-26 DIAGNOSIS — F17210 Nicotine dependence, cigarettes, uncomplicated: Secondary | ICD-10-CM | POA: Diagnosis present

## 2023-11-26 DIAGNOSIS — Z887 Allergy status to serum and vaccine status: Secondary | ICD-10-CM

## 2023-11-26 DIAGNOSIS — E039 Hypothyroidism, unspecified: Secondary | ICD-10-CM | POA: Diagnosis present

## 2023-11-26 DIAGNOSIS — E669 Obesity, unspecified: Secondary | ICD-10-CM | POA: Diagnosis present

## 2023-11-26 DIAGNOSIS — Z794 Long term (current) use of insulin: Secondary | ICD-10-CM | POA: Diagnosis not present

## 2023-11-26 DIAGNOSIS — E162 Hypoglycemia, unspecified: Secondary | ICD-10-CM

## 2023-11-26 DIAGNOSIS — Z885 Allergy status to narcotic agent status: Secondary | ICD-10-CM

## 2023-11-26 DIAGNOSIS — G8929 Other chronic pain: Secondary | ICD-10-CM | POA: Diagnosis present

## 2023-11-26 DIAGNOSIS — E11649 Type 2 diabetes mellitus with hypoglycemia without coma: Secondary | ICD-10-CM | POA: Diagnosis present

## 2023-11-26 DIAGNOSIS — K7200 Acute and subacute hepatic failure without coma: Secondary | ICD-10-CM | POA: Diagnosis not present

## 2023-11-26 DIAGNOSIS — Z6830 Body mass index (BMI) 30.0-30.9, adult: Secondary | ICD-10-CM

## 2023-11-26 DIAGNOSIS — E440 Moderate protein-calorie malnutrition: Secondary | ICD-10-CM | POA: Diagnosis present

## 2023-11-26 DIAGNOSIS — R579 Shock, unspecified: Principal | ICD-10-CM

## 2023-11-26 DIAGNOSIS — Z1152 Encounter for screening for COVID-19: Secondary | ICD-10-CM

## 2023-11-26 DIAGNOSIS — E875 Hyperkalemia: Secondary | ICD-10-CM | POA: Diagnosis present

## 2023-11-26 DIAGNOSIS — E785 Hyperlipidemia, unspecified: Secondary | ICD-10-CM | POA: Diagnosis present

## 2023-11-26 DIAGNOSIS — E872 Acidosis, unspecified: Secondary | ICD-10-CM | POA: Diagnosis present

## 2023-11-26 DIAGNOSIS — I10 Essential (primary) hypertension: Secondary | ICD-10-CM | POA: Diagnosis present

## 2023-11-26 DIAGNOSIS — Z6828 Body mass index (BMI) 28.0-28.9, adult: Secondary | ICD-10-CM

## 2023-11-26 DIAGNOSIS — M1990 Unspecified osteoarthritis, unspecified site: Secondary | ICD-10-CM | POA: Diagnosis present

## 2023-11-26 DIAGNOSIS — G9341 Metabolic encephalopathy: Secondary | ICD-10-CM | POA: Diagnosis present

## 2023-11-26 DIAGNOSIS — Z79899 Other long term (current) drug therapy: Secondary | ICD-10-CM

## 2023-11-26 DIAGNOSIS — E860 Dehydration: Secondary | ICD-10-CM | POA: Diagnosis present

## 2023-11-26 DIAGNOSIS — Z7982 Long term (current) use of aspirin: Secondary | ICD-10-CM

## 2023-11-26 DIAGNOSIS — H5704 Mydriasis: Secondary | ICD-10-CM | POA: Diagnosis present

## 2023-11-26 DIAGNOSIS — N179 Acute kidney failure, unspecified: Secondary | ICD-10-CM

## 2023-11-26 LAB — CBC WITH DIFFERENTIAL/PLATELET
Abs Immature Granulocytes: 0.31 10*3/uL — ABNORMAL HIGH (ref 0.00–0.07)
Basophils Absolute: 0 10*3/uL (ref 0.0–0.1)
Basophils Relative: 0 %
Eosinophils Absolute: 0 10*3/uL (ref 0.0–0.5)
Eosinophils Relative: 0 %
HCT: 38 % (ref 36.0–46.0)
Hemoglobin: 10.7 g/dL — ABNORMAL LOW (ref 12.0–15.0)
Immature Granulocytes: 3 %
Lymphocytes Relative: 18 %
Lymphs Abs: 1.8 10*3/uL (ref 0.7–4.0)
MCH: 26.2 pg (ref 26.0–34.0)
MCHC: 28.2 g/dL — ABNORMAL LOW (ref 30.0–36.0)
MCV: 92.9 fL (ref 80.0–100.0)
Monocytes Absolute: 1.1 10*3/uL — ABNORMAL HIGH (ref 0.1–1.0)
Monocytes Relative: 10 %
Neutro Abs: 6.9 10*3/uL (ref 1.7–7.7)
Neutrophils Relative %: 69 %
Platelets: 235 10*3/uL (ref 150–400)
RBC: 4.09 MIL/uL (ref 3.87–5.11)
RDW: 15.4 % (ref 11.5–15.5)
WBC: 10.1 10*3/uL (ref 4.0–10.5)
nRBC: 0 % (ref 0.0–0.2)

## 2023-11-26 LAB — I-STAT CHEM 8, ED
BUN: 88 mg/dL — ABNORMAL HIGH (ref 8–23)
Calcium, Ion: 1.03 mmol/L — ABNORMAL LOW (ref 1.15–1.40)
Chloride: 102 mmol/L (ref 98–111)
Creatinine, Ser: 7.6 mg/dL — ABNORMAL HIGH (ref 0.44–1.00)
Glucose, Bld: 158 mg/dL — ABNORMAL HIGH (ref 70–99)
HCT: 35 % — ABNORMAL LOW (ref 36.0–46.0)
Hemoglobin: 11.9 g/dL — ABNORMAL LOW (ref 12.0–15.0)
Potassium: 5.9 mmol/L — ABNORMAL HIGH (ref 3.5–5.1)
Sodium: 129 mmol/L — ABNORMAL LOW (ref 135–145)
TCO2: 6 mmol/L — ABNORMAL LOW (ref 22–32)

## 2023-11-26 LAB — T4, FREE: Free T4: 0.5 ng/dL — ABNORMAL LOW (ref 0.61–1.12)

## 2023-11-26 LAB — CBG MONITORING, ED
Glucose-Capillary: 209 mg/dL — ABNORMAL HIGH (ref 70–99)
Glucose-Capillary: 34 mg/dL — CL (ref 70–99)
Glucose-Capillary: 122 mg/dL — ABNORMAL HIGH (ref 70–99)
Glucose-Capillary: 179 mg/dL — ABNORMAL HIGH (ref 70–99)
Glucose-Capillary: 121 mg/dL — ABNORMAL HIGH (ref 70–99)
Glucose-Capillary: 133 mg/dL — ABNORMAL HIGH (ref 70–99)
Glucose-Capillary: 130 mg/dL — ABNORMAL HIGH (ref 70–99)

## 2023-11-26 LAB — TROPONIN I (HIGH SENSITIVITY)
Troponin I (High Sensitivity): 71 ng/L — ABNORMAL HIGH (ref <18)
Troponin I (High Sensitivity): 139 ng/L (ref <18)

## 2023-11-26 LAB — COMPREHENSIVE METABOLIC PANEL
ALT: 13 U/L (ref 0–44)
AST: 24 U/L (ref 15–41)
Albumin: 2.5 g/dL — ABNORMAL LOW (ref 3.5–5.0)
Alkaline Phosphatase: 55 U/L (ref 38–126)
BUN: 80 mg/dL — ABNORMAL HIGH (ref 8–23)
CO2: <7 mmol/L — ABNORMAL LOW (ref 22–32)
Calcium: 8.6 mg/dL — ABNORMAL LOW (ref 8.9–10.3)
Chloride: 96 mmol/L — ABNORMAL LOW (ref 98–111)
Creatinine, Ser: 6.98 mg/dL — ABNORMAL HIGH (ref 0.44–1.00)
GFR, Estimated: 6 mL/min — ABNORMAL LOW (ref >60)
Glucose, Bld: 179 mg/dL — ABNORMAL HIGH (ref 70–99)
Potassium: 6.2 mmol/L — ABNORMAL HIGH (ref 3.5–5.1)
Sodium: 131 mmol/L — ABNORMAL LOW (ref 135–145)
Total Bilirubin: 0.6 mg/dL (ref 0.0–1.2)
Total Protein: 5.4 g/dL — ABNORMAL LOW (ref 6.5–8.1)

## 2023-11-26 LAB — POCT I-STAT 7, (LYTES, BLD GAS, ICA,H+H)
Acid-base deficit: 30 mmol/L — ABNORMAL HIGH (ref 0.0–2.0)
Acid-base deficit: 30 mmol/L — ABNORMAL HIGH (ref 0.0–2.0)
Acid-base deficit: 30 mmol/L — ABNORMAL HIGH (ref 0.0–2.0)
Bicarbonate: 3.3 mmol/L — ABNORMAL LOW (ref 20.0–28.0)
Bicarbonate: 3.4 mmol/L — ABNORMAL LOW (ref 20.0–28.0)
Bicarbonate: 3.4 mmol/L — ABNORMAL LOW (ref 20.0–28.0)
Calcium, Ion: 1.04 mmol/L — ABNORMAL LOW (ref 1.15–1.40)
Calcium, Ion: 1.06 mmol/L — ABNORMAL LOW (ref 1.15–1.40)
Calcium, Ion: 1.07 mmol/L — ABNORMAL LOW (ref 1.15–1.40)
HCT: 38 % (ref 36.0–46.0)
HCT: 38 % (ref 36.0–46.0)
HCT: 38 % (ref 36.0–46.0)
Hemoglobin: 12.9 g/dL (ref 12.0–15.0)
Hemoglobin: 12.9 g/dL (ref 12.0–15.0)
Hemoglobin: 12.9 g/dL (ref 12.0–15.0)
O2 Saturation: 90 %
O2 Saturation: 91 %
O2 Saturation: 91 %
Patient temperature: 34.2
Patient temperature: 34.2
Patient temperature: 34.3
Potassium: 5.9 mmol/L — ABNORMAL HIGH (ref 3.5–5.1)
Potassium: 5.9 mmol/L — ABNORMAL HIGH (ref 3.5–5.1)
Potassium: 5.9 mmol/L — ABNORMAL HIGH (ref 3.5–5.1)
Sodium: 127 mmol/L — ABNORMAL LOW (ref 135–145)
Sodium: 128 mmol/L — ABNORMAL LOW (ref 135–145)
Sodium: 129 mmol/L — ABNORMAL LOW (ref 135–145)
TCO2: <5 mmol/L — ABNORMAL LOW (ref 22–32)
TCO2: <5 mmol/L — ABNORMAL LOW (ref 22–32)
TCO2: <5 mmol/L — ABNORMAL LOW (ref 22–32)
pCO2 arterial: 26.9 mm[Hg] — ABNORMAL LOW (ref 32–48)
pCO2 arterial: 26.9 mm[Hg] — ABNORMAL LOW (ref 32–48)
pCO2 arterial: 28.1 mm[Hg] — ABNORMAL LOW (ref 32–48)
pH, Arterial: 6.67 — CL (ref 7.35–7.45)
pH, Arterial: 6.671 — CL (ref 7.35–7.45)
pH, Arterial: 6.679 — CL (ref 7.35–7.45)
pO2, Arterial: 112 mm[Hg] — ABNORMAL HIGH (ref 83–108)
pO2, Arterial: 116 mm[Hg] — ABNORMAL HIGH (ref 83–108)
pO2, Arterial: 117 mm[Hg] — ABNORMAL HIGH (ref 83–108)

## 2023-11-26 LAB — URINALYSIS, W/ REFLEX TO CULTURE (INFECTION SUSPECTED)
Bilirubin Urine: NEGATIVE
Glucose, UA: NEGATIVE mg/dL
Ketones, ur: NEGATIVE mg/dL
Nitrite: NEGATIVE
Protein, ur: 100 mg/dL — AB
Specific Gravity, Urine: 1.015 (ref 1.005–1.030)
WBC, UA: >50 WBC/hpf (ref 0–5)
pH: 5 (ref 5.0–8.0)

## 2023-11-26 LAB — BLOOD GAS, VENOUS
Drawn by: 78829
O2 Saturation: 66.8 %
Patient temperature: 31.2
pCO2, Ven: 29 mm[Hg] — ABNORMAL LOW (ref 44–60)
pH, Ven: <7.2 — CL (ref 7.25–7.43)
pO2, Ven: 31 mm[Hg] — CL (ref 32–45)

## 2023-11-26 LAB — LACTIC ACID, PLASMA
Lactic Acid, Venous: >9 mmol/L (ref 0.5–1.9)
Lactic Acid, Venous: >9 mmol/L (ref 0.5–1.9)
Lactic Acid, Venous: >9 mmol/L (ref 0.5–1.9)

## 2023-11-26 LAB — RENAL FUNCTION PANEL
Albumin: 2 g/dL — ABNORMAL LOW (ref 3.5–5.0)
BUN: 74 mg/dL — ABNORMAL HIGH (ref 8–23)
CO2: <7 mmol/L — ABNORMAL LOW (ref 22–32)
Calcium: 8.1 mg/dL — ABNORMAL LOW (ref 8.9–10.3)
Chloride: 97 mmol/L — ABNORMAL LOW (ref 98–111)
Creatinine, Ser: 6.74 mg/dL — ABNORMAL HIGH (ref 0.44–1.00)
GFR, Estimated: 6 mL/min — ABNORMAL LOW (ref >60)
Glucose, Bld: 140 mg/dL — ABNORMAL HIGH (ref 70–99)
Phosphorus: >30 mg/dL — ABNORMAL HIGH (ref 2.5–4.6)
Potassium: 5.9 mmol/L — ABNORMAL HIGH (ref 3.5–5.1)
Sodium: 133 mmol/L — ABNORMAL LOW (ref 135–145)

## 2023-11-26 LAB — BASIC METABOLIC PANEL
BUN: 73 mg/dL — ABNORMAL HIGH (ref 8–23)
CO2: <7 mmol/L — ABNORMAL LOW (ref 22–32)
Calcium: 8.2 mg/dL — ABNORMAL LOW (ref 8.9–10.3)
Chloride: 96 mmol/L — ABNORMAL LOW (ref 98–111)
Creatinine, Ser: 6.57 mg/dL — ABNORMAL HIGH (ref 0.44–1.00)
GFR, Estimated: 6 mL/min — ABNORMAL LOW (ref >60)
Glucose, Bld: 193 mg/dL — ABNORMAL HIGH (ref 70–99)
Potassium: 6.3 mmol/L (ref 3.5–5.1)
Sodium: 132 mmol/L — ABNORMAL LOW (ref 135–145)

## 2023-11-26 LAB — PHOSPHORUS: Phosphorus: >30 mg/dL — ABNORMAL HIGH (ref 2.5–4.6)

## 2023-11-26 LAB — MAGNESIUM
Magnesium: 1.8 mg/dL (ref 1.7–2.4)
Magnesium: 1.8 mg/dL (ref 1.7–2.4)

## 2023-11-26 LAB — RESP PANEL BY RT-PCR (RSV, FLU A&B, COVID)  RVPGX2
Influenza A by PCR: NEGATIVE
Influenza B by PCR: NEGATIVE
Resp Syncytial Virus by PCR: NEGATIVE
SARS Coronavirus 2 by RT PCR: NEGATIVE

## 2023-11-26 LAB — MRSA NEXT GEN BY PCR, NASAL: MRSA by PCR Next Gen: NOT DETECTED

## 2023-11-26 LAB — GLUCOSE, CAPILLARY: Glucose-Capillary: 208 mg/dL — ABNORMAL HIGH (ref 70–99)

## 2023-11-26 LAB — TSH: TSH: 23.934 u[IU]/mL — ABNORMAL HIGH (ref 0.350–4.500)

## 2023-11-26 MED ORDER — PRISMASOL BGK 0/2.5 32-2.5 MEQ/L EC SOLN
Status: DC
  Filled 2023-11-26 (×18): qty 5000

## 2023-11-26 MED ORDER — VASOPRESSIN 20 UNITS/100 ML INFUSION FOR SHOCK
0.0000 [IU]/min | INTRAVENOUS | Status: DC
  Administered 2023-11-26: 0.03 [IU]/min via INTRAVENOUS
  Administered 2023-11-27 – 2023-11-28 (×6): 0.04 [IU]/min via INTRAVENOUS
  Filled 2023-11-26 (×6): qty 100

## 2023-11-26 MED ORDER — DEXTROSE 50 % IV SOLN
INTRAVENOUS | Status: AC
  Administered 2023-11-26: 50 mL via INTRAVENOUS
  Filled 2023-11-26: qty 50

## 2023-11-26 MED ORDER — SODIUM BICARBONATE 8.4 % IV SOLN
Freq: Once | INTRAVENOUS | Status: AC
  Filled 2023-11-26: qty 1000

## 2023-11-26 MED ORDER — CHLORHEXIDINE GLUCONATE CLOTH 2 % EX PADS
6.0000 | MEDICATED_PAD | Freq: Every day | CUTANEOUS | Status: DC
  Administered 2023-11-26: 6 via TOPICAL

## 2023-11-26 MED ORDER — LACTATED RINGERS IV BOLUS
1000.0000 mL | Freq: Once | INTRAVENOUS | Status: AC
  Administered 2023-11-26: 1000 mL via INTRAVENOUS

## 2023-11-26 MED ORDER — ORAL CARE MOUTH RINSE: 15.0000 mL | OROMUCOSAL | Status: DC

## 2023-11-26 MED ORDER — CALCIUM GLUCONATE 10 % IV SOLN
INTRAVENOUS | Status: AC
  Filled 2023-11-26: qty 10

## 2023-11-26 MED ORDER — SODIUM CHLORIDE 0.9 % IV SOLN
2.0000 g | Freq: Once | INTRAVENOUS | Status: AC
  Administered 2023-11-26: 2 g via INTRAVENOUS
  Filled 2023-11-26: qty 10

## 2023-11-26 MED ORDER — NOREPINEPHRINE 4 MG/250ML-% IV SOLN
0.0000 ug/min | INTRAVENOUS | Status: DC
  Filled 2023-11-26: qty 250

## 2023-11-26 MED ORDER — ORAL CARE MOUTH RINSE
15.0000 mL | OROMUCOSAL | Status: DC
  Administered 2023-11-26 – 2023-11-28 (×22): 15 mL via OROMUCOSAL

## 2023-11-26 MED ORDER — EPINEPHRINE HCL 5 MG/250ML IV SOLN IN NS
INTRAVENOUS | Status: AC
  Administered 2023-11-26: 0.5 ug/min via INTRAVENOUS
  Filled 2023-11-26: qty 250

## 2023-11-26 MED ORDER — PANTOPRAZOLE SODIUM 40 MG IV SOLR
40.0000 mg | Freq: Two times a day (BID) | INTRAVENOUS | Status: DC
  Administered 2023-11-26: 40 mg via INTRAVENOUS
  Filled 2023-11-26: qty 10

## 2023-11-26 MED ORDER — SODIUM CHLORIDE 0.9 % IV BOLUS
1000.0000 mL | Freq: Once | INTRAVENOUS | Status: AC
Start: 2023-11-26 — End: 2023-11-26
  Administered 2023-11-26: 1000 mL via INTRAVENOUS

## 2023-11-26 MED ORDER — ETOMIDATE 2 MG/ML IV SOLN
INTRAVENOUS | Status: AC
  Filled 2023-11-26: qty 20

## 2023-11-26 MED ORDER — STERILE WATER FOR INJECTION IV SOLN
INTRAVENOUS | Status: DC
  Filled 2023-11-26 (×6): qty 150

## 2023-11-26 MED ORDER — NOREPINEPHRINE 4 MG/250ML-% IV SOLN
0.0000 ug/min | INTRAVENOUS | Status: DC
  Administered 2023-11-26: 2 ug/min via INTRAVENOUS
  Filled 2023-11-26 (×2): qty 250

## 2023-11-26 MED ORDER — DEXAMETHASONE SODIUM PHOSPHATE 10 MG/ML IJ SOLN
10.0000 mg | Freq: Once | INTRAMUSCULAR | Status: AC
  Administered 2023-11-26: 10 mg via INTRAVENOUS
  Filled 2023-11-26: qty 1

## 2023-11-26 MED ORDER — HEPARIN (PORCINE) 2000 UNITS/L FOR CRRT: INTRAVENOUS_CENTRAL | Status: DC | PRN

## 2023-11-26 MED ORDER — LACTATED RINGERS IV SOLN: INTRAVENOUS | Status: DC

## 2023-11-26 MED ORDER — HEPARIN SODIUM (PORCINE) 1000 UNIT/ML DIALYSIS
1000.0000 [IU] | INTRAMUSCULAR | Status: DC | PRN
  Filled 2023-11-26: qty 5

## 2023-11-26 MED ORDER — VANCOMYCIN HCL IN DEXTROSE 1-5 GM/200ML-% IV SOLN
1000.0000 mg | Freq: Once | INTRAVENOUS | Status: AC
  Administered 2023-11-26: 1000 mg via INTRAVENOUS

## 2023-11-26 MED ORDER — VANCOMYCIN HCL 2000 MG/400ML IV SOLN: 2000.0000 mg | Freq: Once | INTRAVENOUS | Status: DC

## 2023-11-26 MED ORDER — METRONIDAZOLE 500 MG/100ML IV SOLN
500.0000 mg | Freq: Once | INTRAVENOUS | Status: AC
  Administered 2023-11-26: 500 mg via INTRAVENOUS
  Filled 2023-11-26: qty 100

## 2023-11-26 MED ORDER — ROCURONIUM BROMIDE 10 MG/ML (PF) SYRINGE
PREFILLED_SYRINGE | INTRAVENOUS | Status: AC
  Filled 2023-11-26: qty 10

## 2023-11-26 MED ORDER — SODIUM CHLORIDE 0.9 % IV SOLN
500.0000 [IU]/h | INTRAVENOUS | Status: DC
  Administered 2023-11-27 (×2): 500 [IU]/h via INTRAVENOUS_CENTRAL
  Filled 2023-11-26 (×2): qty 2

## 2023-11-26 MED ORDER — SODIUM BICARBONATE 8.4 % IV SOLN
100.0000 meq | Freq: Once | INTRAVENOUS | Status: AC
  Administered 2023-11-26: 100 meq via INTRAVENOUS
  Filled 2023-11-26: qty 100

## 2023-11-26 MED ORDER — ORAL CARE MOUTH RINSE: 15.0000 mL | OROMUCOSAL | Status: DC | PRN

## 2023-11-26 MED ORDER — SODIUM CHLORIDE 0.9 % IV SOLN
250.0000 mL | INTRAVENOUS | Status: AC
  Administered 2023-11-26: 250 mL via INTRAVENOUS

## 2023-11-26 MED ORDER — DEXTROSE 50 % IV SOLN: 50.0000 mL | Freq: Once | INTRAVENOUS | Status: AC

## 2023-11-26 MED ORDER — VANCOMYCIN HCL IN DEXTROSE 1-5 GM/200ML-% IV SOLN: 1000.0000 mg | Freq: Once | INTRAVENOUS | Status: DC

## 2023-11-26 MED ORDER — NOREPINEPHRINE 16 MG/250ML-% IV SOLN
0.0000 ug/min | INTRAVENOUS | Status: DC
  Administered 2023-11-26: 26 ug/min via INTRAVENOUS
  Administered 2023-11-27: 53 ug/min via INTRAVENOUS
  Administered 2023-11-27: 60 ug/min via INTRAVENOUS
  Administered 2023-11-27: 56 ug/min via INTRAVENOUS
  Administered 2023-11-27: 42 ug/min via INTRAVENOUS
  Administered 2023-11-28: 60 ug/min via INTRAVENOUS
  Administered 2023-11-28: 49 ug/min via INTRAVENOUS
  Administered 2023-11-28: 54 ug/min via INTRAVENOUS
  Administered 2023-11-28: 55 ug/min via INTRAVENOUS
  Filled 2023-11-26 (×9): qty 250

## 2023-11-26 MED ORDER — PANTOPRAZOLE SODIUM 40 MG IV SOLR: 40.0000 mg | Freq: Every day | INTRAVENOUS | Status: DC

## 2023-11-26 MED ORDER — EPINEPHRINE HCL 5 MG/250ML IV SOLN IN NS
0.5000 ug/min | INTRAVENOUS | Status: DC
  Administered 2023-11-27: 20 ug/min via INTRAVENOUS
  Administered 2023-11-27: 15 ug/min via INTRAVENOUS
  Administered 2023-11-27: 17 ug/min via INTRAVENOUS
  Administered 2023-11-27: 15 ug/min via INTRAVENOUS
  Administered 2023-11-28: 18 ug/min via INTRAVENOUS
  Administered 2023-11-28: 20 ug/min via INTRAVENOUS
  Administered 2023-11-28: 10 ug/min via INTRAVENOUS
  Filled 2023-11-26 (×8): qty 250

## 2023-11-26 MED ORDER — VANCOMYCIN HCL IN DEXTROSE 1-5 GM/200ML-% IV SOLN
1000.0000 mg | Freq: Once | INTRAVENOUS | Status: AC
  Administered 2023-11-26: 1000 mg via INTRAVENOUS
  Filled 2023-11-26: qty 200

## 2023-11-26 MED ORDER — STERILE WATER FOR INJECTION IV SOLN
INTRAVENOUS | Status: DC
  Filled 2023-11-26 (×2): qty 1000
  Filled 2023-11-26: qty 150
  Filled 2023-11-26 (×2): qty 1000

## 2023-11-26 NOTE — ED Notes (Addendum)
1220 - Pt in triage with CBG 34 1222- D50 given   1227 - epi given 1229 - Began CPR 1230 - Calcium given 1231 - Bicarb given 1231 - epi given 1232 - pulse check  - resumed compressions 1233 - faint pulse felt - epi pushed 1235 - epi drip began 0.5 mcg / min 1237 - 1.5 epi drip  1245 - etomidate given 1246 - Roc given  1247- Tube inserted, 23 @ tip with color change  1251 - epi drip 2.5 1255 - epi 3.5  MAP 58  1255 - OG tube inserted 38F, measured at 55cm  1305 - epi 5.5 MAP 53 1308 - Levo began  1322 - Levo 4  Epi - 7.5 1328 - Levo 5  Epi - 8.5 1335 - Levo - 8  Epi - 11.5 1343 - Levo - 9  Epi -  1343 - Levo - 9  Epi - 12.5 1405 - Levo - 10.0  Epi - 14.5 1408 - Epi - 15.5 1438 - Epi - 16.5 1440 - Epi - 17.5 1442 - Epi - 18.5 1444 - Epi - 19.5

## 2023-11-26 NOTE — Procedures (Signed)
Arterial Catheter Insertion Procedure Note  Vicki Murphy  [id number]  1951/02/05  Date:11/26/23  Time:8:50 PM    Provider Performing: [name]    Procedure: Insertion of Arterial Line (40981) with US guidance (19147)   Indication(s) Blood pressure monitoring and/or need for frequent ABGs  Consent Unable to obtain consent due to emergent nature of procedure.  Anesthesia None   Time Out Verified patient identification, verified procedure, site/side was marked, verified correct patient position, special equipment/implants available, medications/allergies/relevant history reviewed, required imaging and test results available.   Sterile Technique Maximal sterile technique including full sterile barrier drape, hand hygiene, sterile gown, sterile gloves, mask, hair covering, sterile ultrasound probe cover (if used).   Procedure Description Area of catheter insertion was cleaned with chlorhexidine and draped in sterile fashion. With real-time ultrasound guidance an arterial catheter was placed into the right radial artery.  Appropriate arterial tracings confirmed on monitor.     Complications/Tolerance None; patient tolerated the procedure well.   EBL Minimal   Specimen(s) None   [name], PA-C [facility] 11/26/23 8:50 PM  Please see [email or web address] for pager details.  From 7A-7P if no response, please call [phone] After hours, please call ELink [phone]

## 2023-11-26 NOTE — Consult Note (Signed)
Reason for Consult:AKI  Referring Physician: Gaynell Face, DO  Vicki Murphy is an 73 y.o. female with a PMH significant for DM type 2, achalasia, HTN, HLD, depression, PTSD, DJD, chronic pain in right knee, and SVT who presented to [name] ED from home with AMS and hypoglycemia with CBG of 34.  Her condition deteriorated rapidly and temp 88.2, Bp 43/36, HR 62.  She developed a brief PEA arrest and was intubated and started on pressors.  Code sepsis started.  Labs were notable for venous blood gas with pH <7.2, pCo2 29, pO2 31, Na 131, K 6.2, Cl 96, Co2 <7, BUN 80, Cr 6.98, Gluc 179, Ca 8.6, phos >30, alb 2.5, lactate >9, Hgb 10.7, ABG pH 6.67, pCo2 28, pO2 112.  She was transferred to [facility] ICU and we were consulted due to the development of AKI and lactic acidosis.    She is currently intubated and unresponsive.  This HPI was obtained by review of the EMR and discussion with PCCM.  Trend in Creatinine:  Creatinine, Ser  Date/Time Value Ref Range Status  11/26/2023 06:23 PM 6.74 (H) 0.44 - 1.00 mg/dL Final  16/08/9603 54:09 PM 6.98 (H) 0.44 - 1.00 mg/dL Final  81/19/1478 29:56 PM 7.60 (H) 0.44 - 1.00 mg/dL Final  21/30/8657 84:69 PM 1.0 0.6 - 1.4 mg/dL Final  62/95/2841 32:44 PM 0.92 0.50 - 0.99 mg/dL Final    PMH:   Past Medical History:  Diagnosis Date   Arthritis    Diabetes mellitus    Hypertension     PSH:   Past Surgical History:  Procedure Laterality Date   BREAST SURGERY     bio-psy negative   CARPAL TUNNEL RELEASE     right   JOINT REPLACEMENT  1992 and 2001   right knee    Allergies:  Allergies  Allergen Reactions   Morphine And Codeine    Penicillins    Tetanus-Diphtheria Toxoids Td     Medications:   Prior to Admission medications   Medication Sig Start Date End Date Taking? Authorizing Provider  albuterol (VENTOLIN HFA) 108 (90 Base) MCG/ACT inhaler Inhale into the lungs. 10/29/23   [provider]  ALBUTEROL IN Inhale into the lungs.    [provider]  aspirin 81 MG tablet Take 81 mg by mouth daily.    [provider]  atenolol (TENORMIN) 50 MG tablet Take 50 mg by mouth daily.    [provider]  atorvastatin (LIPITOR) 40 MG tablet Take 40 mg by mouth daily.    [provider]  calcium acetate, Phos Binder, (PHOSLYRA) 667 MG/5ML SOLN Take by mouth 3 (three) times daily with meals.    [provider]  chlorthalidone (HYGROTON) 25 MG tablet Take by mouth. 07/14/23   [provider]  Clobetasol Prop Emollient Base 0.05 % emollient cream Apply topically 2 (two) times daily.    [provider]  cyclobenzaprine (FLEXERIL) 10 MG tablet Take 10 mg by mouth 3 (three) times daily as needed for muscle spasms.    [provider]  diclofenac Sodium (VOLTAREN) 1 % GEL Apply topically. 10/29/23   [provider]  EPINEPHrine 0.3 mg/0.3 mL IJ SOAJ injection Inject into the muscle. 10/29/23   [provider]  fluorometholone (FML) 0.1 % ophthalmic suspension 1 drop every 4 (four) hours.    [provider]  fluticasone-salmeterol (ADVAIR) 250-50 MCG/ACT AEPB Inhale into the lungs. 10/29/23   [provider]  gabapentin (NEURONTIN) 300 MG capsule Take  1 capsule (300 mg total) by mouth 3 (three) times daily. 06/22/20   Vivi Barrack, DPM  glucose 4 GM chewable tablet Chew 1 tablet by mouth as needed for low blood sugar.    [provider]  hydrocerin (EUCERIN) CREA Apply 1 application topically 2 (two) times daily.    [provider]  hydrochlorothiazide (HYDRODIURIL) 25 MG tablet Take 25 mg by mouth daily.    [provider]  HYDROXYZINE HCL PO Take by mouth.    [provider]  INSULIN ASPART FLEXPEN [place] Inject into the skin.    [provider]  insulin detemir (LEVEMIR) 100 UNIT/ML injection Inject into the skin daily.    [provider]  LACTOBACILLUS PO Take by mouth.    [provider]  melatonin 3 MG TABS tablet Take 3 mg by mouth at bedtime.    [provider]  metFORMIN (GLUCOPHAGE) 1000 MG tablet Take 1,000 mg by mouth 3 (three) times daily between meals.     [provider]  naloxone [facility]) nasal spray 4 mg/0.1 mL Place into the nose. 10/29/23   [provider]  nitroGLYCERIN (NITROSTAT) 0.4 MG SL tablet Place under the tongue. 03/25/23   [provider]  OMEPRAZOLE PO Take by mouth.    [provider]  oxyCODONE (ROXICODONE) 5 MG immediate release tablet Take 5 mg by mouth every 4 (four) hours as needed for severe pain.    [provider]  pregabalin (LYRICA) 200 MG capsule Take by mouth. 07/14/23   [provider]  QUEtiapine (SEROQUEL) 200 MG tablet Take by mouth. 02/03/23   [provider]  QUETIAPINE FUMARATE PO Take by mouth.    [provider]  sertraline (ZOLOFT) 100 MG tablet Take 100 mg by mouth daily.    [provider]  Tiotropium Bromide Monohydrate 1.25 MCG/ACT AERS Inhale into the lungs.    [provider]  TRIAMCINOLONE PO Take by mouth.    [provider]    Inpatient medications:  Chlorhexidine Gluconate Cloth  6 each Topical Daily   dexamethasone (DECADRON) injection  10 mg Intravenous Once   mouth rinse  15 mL Mouth Rinse Q2H   pantoprazole (PROTONIX) IV  40 mg Intravenous Q12H   sodium bicarbonate  100 mEq Intravenous Once    Discontinued Meds:   Medications Discontinued During This Encounter  Medication Reason   vancomycin (VANCOCIN) IVPB 1000 mg/200 mL premix    vancomycin (VANCOREADY) IVPB 2000 mg/400 mL    Oral care mouth rinse Duplicate   Oral care mouth rinse Duplicate   pantoprazole (PROTONIX) injection 40 mg    norepinephrine (LEVOPHED) 4mg  in (0.016 mg/mL) premix infusion     Social History:  reports that she has been smoking cigarettes. She has never used smokeless tobacco. She reports that she does not drink  alcohol and does not use drugs.  Family History:  History reviewed. No pertinent family history.  Review of systems not obtained due to patient factors. Weight change:   Intake/Output Summary (Last 24 hours) at 11/26/2023 2124 Last data filed at 11/26/2023 1837 Gross per 24 hour  Intake 2320.81 ml  Output --  Net 2320.81 ml   BP (!) 100/55   Pulse 62   Temp (!) 93.9 F (34.4 C)   Resp 20   Ht 6' (1.829 m)   Wt 95.3 kg   SpO2 100%   BMI 28.49 kg/m  Vitals:   11/26/23 1815 11/26/23 1830  11/26/23 1835 11/26/23 1932  BP: (!) 103/53 (!) 87/53 (!) 100/55   Pulse:  61 62   Resp: 20 20 20    Temp: (!) 93.8 F (34.3 C) (!) 93.9 F (34.4 C) (!) 93.9 F (34.4 C)   SpO2:  100% 100% 100%  Weight:      Height:         General appearance: Intubated, critically ill, and unresponsive Head: Normocephalic, without obvious abnormality, atraumatic Eyes: positive findings: pupillary abnormality: fixed and dilated Resp: ventilated BS bilaterally Cardio: bradycardic, no rub GI: soft, non-tender; bowel sounds normal; no masses,  no organomegaly Extremities: edema trace pretibial edema bilaterally Neuro:  unresponsive  Labs: Basic Metabolic Panel: Recent Labs  Lab 11/26/23 1214 11/26/23 1221 11/26/23 1823 11/26/23 2106 11/26/23 2112 11/26/23 2117  NA 129* 131* 133* 128* 127* 129*  K 5.9* 6.2* 5.9* 5.9* 5.9* 5.9*  CL 102 96* 97*  --   --   --   CO2  --  <7* <7*  --   --   --   GLUCOSE 158* 179* 140*  --   --   --   BUN 88* 80* 74*  --   --   --   CREATININE 7.60* 6.98* 6.74*  --   --   --   ALBUMIN  --  2.5* 2.0*  --   --   --   CALCIUM  --  8.6* 8.1*  --   --   --   PHOS  --   --  >30.0*  --   --   --    Liver Function Tests: Recent Labs  Lab 11/26/23 1221 11/26/23 1823  AST 24  --   ALT 13  --   ALKPHOS 55  --   BILITOT 0.6  --   PROT 5.4*  --   ALBUMIN 2.5* 2.0*   No results for input(s): "LIPASE", "AMYLASE" in the last 168 hours. No results for input(s):  "AMMONIA" in the last 168 hours. CBC: Recent Labs  Lab 11/26/23 1221 11/26/23 2106 11/26/23 2112 11/26/23 2117  WBC 10.1  --   --   --   NEUTROABS 6.9  --   --   --   HGB 10.7* 12.9 12.9 12.9  HCT 38.0 38.0 38.0 38.0  MCV 92.9  --   --   --   PLT 235  --   --   --    PT/INR: @LABRCNTIP (inr:5) Cardiac Enzymes: )No results for input(s): "CKTOTAL", "CKMB", "CKMBINDEX", "TROPONINI" in the last 168 hours. CBG: Recent Labs  Lab 11/26/23 1150 11/26/23 1229 11/26/23 1430 11/26/23 1703 11/26/23 1814  GLUCAP 179* 122* 121* 133* 130*    Iron Studies: No results for input(s): "IRON", "TIBC", "TRANSFERRIN", "FERRITIN" in the last 168 hours.  Xrays/Other Studies: CT Head Wo Contrast Result Date: 11/26/2023 CLINICAL DATA:  Head trauma, mental status change.  Neck trauma. EXAM: CT HEAD WITHOUT CONTRAST CT CERVICAL SPINE WITHOUT CONTRAST TECHNIQUE: Multidetector CT imaging of the head and cervical spine was performed following the standard protocol without intravenous contrast. Multiplanar CT image reconstructions of the cervical spine were also generated. RADIATION DOSE REDUCTION: This exam was performed according to the departmental dose-optimization program which includes automated exposure control, adjustment of the mA and/or kV according to patient size and/or use of iterative reconstruction technique. COMPARISON:  None Available. FINDINGS: CT HEAD FINDINGS Brain: No acute intracranial hemorrhage, midline shift or mass effect. No extra-axial fluid collection is seen. Mild periventricular white matter hypodensities  are present bilaterally. No hydrocephalus. Vascular: No hyperdense vessel or unexpected calcification. Skull: Normal. Negative for fracture or focal lesion. Sinuses/Orbits: Diffuse mucosal thickening is noted in the paranasal sinuses. No acute orbital abnormality is seen. Other: None. CT CERVICAL SPINE FINDINGS Alignment: There is mild anterolisthesis at C4-C5. Skull base and  vertebrae: No acute fracture. Soft tissues and spinal canal: No prevertebral fluid or swelling. No visible canal hematoma. Disc levels: Multilevel intervertebral disc space narrowing and advanced facet arthropathy, most pronounced at C5-C6. Upper chest: No acute abnormality. Other: Endotracheal and enteric tubes are noted. Rim calcified nodules are noted in the right lobe of the thyroid gland measuring up to 1.3 cm. No follow-up imaging is recommended. IMPRESSION: 1. No acute intracranial process. 2. Advanced degenerative changes in the cervical spine without evidence of acute fracture. Electronically Signed   By: Thornell Sartorius M.D.   On: 11/26/2023 20:00   CT Cervical Spine Wo Contrast Result Date: 11/26/2023 CLINICAL DATA:  Head trauma, mental status change.  Neck trauma. EXAM: CT HEAD WITHOUT CONTRAST CT CERVICAL SPINE WITHOUT CONTRAST TECHNIQUE: Multidetector CT imaging of the head and cervical spine was performed following the standard protocol without intravenous contrast. Multiplanar CT image reconstructions of the cervical spine were also generated. RADIATION DOSE REDUCTION: This exam was performed according to the departmental dose-optimization program which includes automated exposure control, adjustment of the mA and/or kV according to patient size and/or use of iterative reconstruction technique. COMPARISON:  None Available. FINDINGS: CT HEAD FINDINGS Brain: No acute intracranial hemorrhage, midline shift or mass effect. No extra-axial fluid collection is seen. Mild periventricular white matter hypodensities are present bilaterally. No hydrocephalus. Vascular: No hyperdense vessel or unexpected calcification. Skull: Normal. Negative for fracture or focal lesion. Sinuses/Orbits: Diffuse mucosal thickening is noted in the paranasal sinuses. No acute orbital abnormality is seen. Other: None. CT CERVICAL SPINE FINDINGS Alignment: There is mild anterolisthesis at C4-C5. Skull base and vertebrae: No acute  fracture. Soft tissues and spinal canal: No prevertebral fluid or swelling. No visible canal hematoma. Disc levels: Multilevel intervertebral disc space narrowing and advanced facet arthropathy, most pronounced at C5-C6. Upper chest: No acute abnormality. Other: Endotracheal and enteric tubes are noted. Rim calcified nodules are noted in the right lobe of the thyroid gland measuring up to 1.3 cm. No follow-up imaging is recommended. IMPRESSION: 1. No acute intracranial process. 2. Advanced degenerative changes in the cervical spine without evidence of acute fracture. Electronically Signed   By: Thornell Sartorius M.D.   On: 11/26/2023 20:00   DG Chest Port 1 View Result Date: 11/26/2023 CLINICAL DATA:  Respiratory failure, verify endotracheal tube placement. EXAM: PORTABLE CHEST 1 VIEW COMPARISON:  11/26/2023. FINDINGS: The heart size and mediastinal contours are within normal limits. Lung volumes are low with atelectasis at the lung bases. Mild interstitial prominence is noted bilaterally. No effusion or pneumothorax is seen. An endotracheal tube terminates 3.6 cm above the carina. An enteric tube courses over the left upper quadrant and out of the field of view. No acute osseous abnormality is seen. IMPRESSION: 1. Low lung volumes with atelectasis at the lung bases. 2. Support apparatus as described above. Electronically Signed   By: Thornell Sartorius M.D.   On: 11/26/2023 19:49   DG Chest Port 1 View Result Date: 11/26/2023 CLINICAL DATA:  Post intubation. EXAM: PORTABLE CHEST 1 VIEW COMPARISON:  X-ray 06/16/2005 FINDINGS: Enteric tube in place with the tip extending beneath the diaphragm. ET tube in place with the tip seen proximally 6  cm above the carina. This could be advanced 2 cm. Overlapping cardiac leads and defibrillator pads. Normal cardiopericardial silhouette. No pneumothorax or effusion. Diffuse bilateral interstitial changes. Acute process is possible. IMPRESSION: ET tube and enteric tube in place. ET  tube seen 6 cm above the carina. This could be advanced 2 cm. Diffuse interstitial changes seen of the lungs. Edema versus infectious or inflammatory process is possible. Recommend follow up Electronically Signed   By: Karen Kays M.D.   On: 11/26/2023 13:33     Assessment/Plan:  AKI - ischemic ATN in setting of cardiac arrest and shock.  Now with ongoing hyperkalemia and significant metabolic acidosis.  Discussed with PCCM team and will likely attempt trial of CRRT unless family wishes to transition to comfort measures.  Does not meet criteria for brain death per Neuro and EEG.  For now plan is to proceed with CRRT and follow her response.   Avoid nephrotoxic medications including NSAIDs and iodinated intravenous contrast exposure unless the latter is absolutely indicated.   Preferred narcotic agents for pain control are hydromorphone, fentanyl, and methadone. Morphine should not be used.  Avoid Baclofen and avoid oral sodium phosphate and magnesium citrate based laxatives / bowel preps.  Continue strict Input and Output monitoring. Will monitor the patient closely with you and intervene or adjust therapy as indicated by changes in clinical status/labs   Cardiac arrest - unclear etiology.  Workup underway. Shock - unclear if this is septic or cardiogenic.  Currently on 3 pressors. Hypoglycemia, severe - improved with D50. AGMA/lactic acidosis - and ARF.  Currently on isotonic bicarb drip.  Will use isotonic bicarb with replacement fluids once CRRT is begun. Hyperphosphatemia - markedly elevated.  No mention of FLEETS enema on medlist.  Possibly related to arrest and/or lactic acidosis.  She was on Phoslyra as an outpatient, however I could not find a serum phos level on the [place] records.  Her baseline Scr was 1 so doubt she had hyperphosphatemia due to CKD.  Will recheck to verify and follow after initiation of CRRT. Hyperkalemia - as above to start CRRT Acute hypoxic respiratory failure - currently  intubated and vent settings per PCCM.   Acute metabolic encephalopathy - unresponsive and fixed and dilated pupils.  EEG performed in ICU with generalized slowing, no seizure activity.  Consult neurology to help with prognosis. DM with hypoglycemia - hold insulin and follow HTN - now hypotensive off of meds.  Pressors as above.    Julien Nordmann Zadiel Leyh 11/26/2023, 9:24 PM

## 2023-11-26 NOTE — ED Triage Notes (Signed)
Pt BIB private vehicle by family for hypoglycemic. Pt CBG on arrival 34.

## 2023-11-26 NOTE — H&P (Signed)
NAME:  Vicki Murphy, MRN:  [id number], DOB:  1951-09-06, LOS: 0 ADMISSION DATE:  11/26/2023, CONSULTATION DATE:  11/26/23 REFERRING MD:  Dr. Suezanne Jacquet, CHIEF COMPLAINT:  AMS   History of Present Illness:  Pt encephalopathic, therefore HPI obtained from EMR.  55 yoF with unclear PMH as appears medical care provided at [place], known HTN, HLD, and DM who presented to [facility] after being confused and hypoglycemia on her dexcom.  Reportedly at her baseline yesterday but possibly had recent cold/cough.  Presented unresponsive, hypotensive, bradycardic, and hypothermic at 88.2 progressed to brief cardiac arrest, ROSC after one round, s/p intubation and vasopressor support on epi and levophed.  Glucose initially 34.  Labs noted for lactic > 9, WBC 10.1, H/H 10.7/ 38, BUN/ sCr 80/ 6.98, K 6.2, undetectable bicarb, Na 131, Cl 96low albumin/ protein.  SARS/ flu/ RSV neg. UA mod leukocytes, WBC> 50, EKG noted afib with rate of 25, VBG ph < 7.2, CO2 29.  CXR noted for ETT 6cm above carina, diffuse interstitial changes, edema vs inflammatory process, gastric below diaphragm.  D50, bicarb gtt, 2L crystalloid given.  Cultured and empiric aztreonam, vanc and flagyl started.  Femoral CVL placed.  Transferred to [facility] for higher level of care, PCCM admitting.   Pertinent  Medical History  Limited as appears to receive care from [place] HTN, DM, HLD, chronic pain, resp? From home meds  Significant Hospital Events: Including procedures, antibiotic start and stop dates in addition to other pertinent events   1/16 admit/ tx [facility], cardiac arrest   Interim History / Subjective:  Arrived on epi 16.5, NE 15, bicarb gtt Remains unresponsive, no sedation given since RSI w/ etomidate and roc  Objective   Blood pressure (!) 87/71, temperature (!) 88.2 F (31.2 C), resp. rate 16, height 6' (1.829 m), weight 95.3 kg.    Vent Mode: PRVC FiO2 (%):  [50 %] 50 % Set Rate:  [24 bmp] 24 bmp Vt Set:  [580 mL] 580 mL PEEP:  [0 cmH20] 0  cmH20  No intake or output data in the 24 hours ending 11/26/23 1509 Filed Weights   11/26/23 1127  Weight: 95.3 kg   Examination: General:  critically ill appearing elderly female lying in ER stretcher in NAD on bairhugger HEENT: MM pink/moist, ETT/ OGT, pupils fixed/dilated, absent corneal's, mild exopthalmos, elevated JVP Neuro: unresponsive to noxious stimuli, no gag/ cough CV: rr, NSR, no obvious murmur PULM:  non labored on MV, not breathing over vent, clear anteriorly, no wheeze GI: soft, bs faint, ND, foley with hazy yellow urine Extremities: warm/dry, trace LE edema  Skin: no rashes, posterior not examined at this time  Resolved Hospital Problem list    Assessment & Plan:   Cardiac arrest - unclear etiology, r/o septic possible UTI, cardiogenic - add vaso to reduce epi, cont NE, goal MAP goal > 65 - aline - trend lactic - check PCT, follow cultures from [facility], continue broad abx coverage  - consider CNS workup pending CTH - echo, trop, repeat EKG - warming measures, avoid fever  - correct metabolic derangements as below   Acute hypoxic respiratory failure - cont full MV support, 4-8cc/kg IBW with goal Pplat <30 and DP<15  - VAP prevention protocol/ PPI - PAD protocol for sedation> not currently needed  - CXR/ ABG now - wean peep/ FiO2 as able for SpO2 >92%  - daily SAT & SBT when appropriate  - duonebs prn    Acute metabolic encephalopathy - CTH/ CT cervical now  given poor neuro exam - hemodynamic support as above - neuroprotective measures  - check TSH/ ammonia  AKI  Hyperkalemia Severe AGAMA/ lactic acidosis  hyponatremia - continue foley, consider urine lytes/ renal US - bicarb gtt - recheck BMET now - trend renal indices  - Trend BMP / urinary output - Replace electrolytes as indicated - Avoid nephrotoxic agents, ensure adequate renal perfusion   DM with hypoglycemia  - CBG q4 and prn, hold SSI for now given hypoglycemia    HTN HLD - hold  [facility] meds in setting of shock  ? Chronic pain - hold home pta lyrica, gabapentin   Best Practice (right click and "Reselect all SmartList Selections" daily)   Diet/type: NPO DVT prophylaxis SCD Pressure ulcer(s): pressure ulcer assessment deferred  GI prophylaxis: PPI Lines: Central line Foley:  Yes, and it is still needed Code Status:  full code Last date of multidisciplinary goals of care discussion [pending]  Arabella Merles [phone] attempted to reach, no answer, just rang.  Will continue to try and reach family  Labs   CBC: Recent Labs  Lab 11/26/23 1214 11/26/23 1221  WBC  --  10.1  NEUTROABS  --  6.9  HGB 11.9* 10.7*  HCT 35.0* 38.0  MCV  --  92.9  PLT  --  235    Basic Metabolic Panel: Recent Labs  Lab 11/26/23 1214 11/26/23 1221  NA 129* 131*  K 5.9* 6.2*  CL 102 96*  CO2  --  <7*  GLUCOSE 158* 179*  BUN 88* 80*  CREATININE 7.60* 6.98*  CALCIUM  --  8.6*   GFR: Estimated Creatinine Clearance: 9.6 mL/min (A) (by C-G formula based on SCr of 6.98 mg/dL (H)). Recent Labs  Lab 11/26/23 1221 11/26/23 1350  WBC 10.1  --   LATICACIDVEN >9.0* >9.0*    Liver Function Tests: Recent Labs  Lab 11/26/23 1221  AST 24  ALT 13  ALKPHOS 55  BILITOT 0.6  PROT 5.4*  ALBUMIN 2.5*   No results for input(s): "LIPASE", "AMYLASE" in the last 168 hours. No results for input(s): "AMMONIA" in the last 168 hours.  ABG    Component Value Date/Time   HCO3 NOT CALCULATED 11/26/2023 1215   TCO2 6 (L) 11/26/2023 1214   O2SAT 66.8 11/26/2023 1215     Coagulation Profile: No results for input(s): "INR", "PROTIME" in the last 168 hours.  Cardiac Enzymes: No results for input(s): "CKTOTAL", "CKMB", "CKMBINDEX", "TROPONINI" in the last 168 hours.  HbA1C: Hgb A1c MFr Bld  Date/Time Value Ref Range Status  06/07/2020 03:43 PM 6.9 (H) <5.7 % of total Hgb Final    Comment:    For someone without known diabetes, a hemoglobin A1c value of 6.5% or  greater indicates that they may have  diabetes and this should be confirmed with a follow-up  test. . For someone with known diabetes, a value <7% indicates  that their diabetes is well controlled and a value  greater than or equal to 7% indicates suboptimal  control. A1c targets should be individualized based on  duration of diabetes, age, comorbid conditions, and  other considerations. . Currently, no consensus exists regarding use of hemoglobin A1c for diagnosis of diabetes for children. .     CBG: Recent Labs  Lab 11/26/23 1121 11/26/23 1129 11/26/23 1150 11/26/23 1229 11/26/23 1430  GLUCAP 34* 209* 179* 122* 121*    Review of Systems:   Unable   Past Medical History:  She,  has a  past medical history of Arthritis, Diabetes mellitus, and Hypertension.   Surgical History:   Past Surgical History:  Procedure Laterality Date   BREAST SURGERY     bio-psy negative   CARPAL TUNNEL RELEASE     right   JOINT REPLACEMENT  1992 and 2001   right knee     Social History:   reports that she has been smoking cigarettes. She has never used smokeless tobacco. She reports that she does not drink alcohol and does not use drugs.   Family History:  Her family history is not on file.   Allergies Allergies  Allergen Reactions   Morphine And Codeine    Penicillins    Tetanus-Diphtheria Toxoids Td      Home Medications  Prior to Admission medications   Medication Sig Start Date End Date Taking? Authorizing Provider  albuterol (VENTOLIN HFA) 108 (90 Base) MCG/ACT inhaler Inhale into the lungs. 10/29/23  Yes [provider]  chlorthalidone (HYGROTON) 25 MG tablet Take by mouth. 07/14/23  Yes [provider]  diclofenac Sodium (VOLTAREN) 1 % GEL Apply topically. 10/29/23  Yes [provider]  EPINEPHrine 0.3 mg/0.3 mL IJ SOAJ injection Inject into the muscle. 10/29/23  Yes [provider]  fluticasone-salmeterol (ADVAIR) 250-50 MCG/ACT  AEPB Inhale into the lungs. 10/29/23  Yes [provider]  naloxone (NARCAN) nasal spray 4 mg/0.1 mL Place into the nose. 10/29/23  Yes [provider]  nitroGLYCERIN (NITROSTAT) 0.4 MG SL tablet Place under the tongue. 03/25/23  Yes [provider]  pregabalin (LYRICA) 200 MG capsule Take by mouth. 07/14/23  Yes [provider]  QUEtiapine (SEROQUEL) 200 MG tablet Take by mouth. 02/03/23  Yes [provider]  ALBUTEROL IN Inhale into the lungs.    [provider]  aspirin 81 MG tablet Take 81 mg by mouth daily.    [provider]  atenolol (TENORMIN) 50 MG tablet Take 50 mg by mouth daily.    [provider]  atorvastatin (LIPITOR) 40 MG tablet Take 40 mg by mouth daily.    [provider]  calcium acetate, Phos Binder, (PHOSLYRA) 667 MG/5ML SOLN Take by mouth 3 (three) times daily with meals.    [provider]  Clobetasol Prop Emollient Base 0.05 % emollient cream Apply topically 2 (two) times daily.    [provider]  cyclobenzaprine (FLEXERIL) 10 MG tablet Take 10 mg by mouth 3 (three) times daily as needed for muscle spasms.    [provider]  fluorometholone (FML) 0.1 % ophthalmic suspension 1 drop every 4 (four) hours.    [provider]  gabapentin (NEURONTIN) 300 MG capsule Take 1 capsule (300 mg total) by mouth 3 (three) times daily. 06/22/20   Vivi Barrack, DPM  glucose 4 GM chewable tablet Chew 1 tablet by mouth as needed for low blood sugar.    [provider]  hydrocerin (EUCERIN) CREA Apply 1 application topically 2 (two) times daily.    [provider]  hydrochlorothiazide (HYDRODIURIL) 25 MG tablet Take 25 mg by mouth daily.    [provider]  HYDROXYZINE HCL PO Take by mouth.    [provider]  INSULIN ASPART FLEXPEN [place] Inject into the skin.    [provider]  insulin detemir (LEVEMIR) 100 UNIT/ML injection Inject  into the skin daily.    [provider]  LACTOBACILLUS PO Take by mouth.    [provider]  melatonin 3 MG TABS tablet Take  3 mg by mouth at bedtime.    [provider]  metFORMIN (GLUCOPHAGE) 1000 MG tablet Take 1,000 mg by mouth 3 (three) times daily between meals.     [provider]  OMEPRAZOLE PO Take by mouth.    [provider]  oxyCODONE (ROXICODONE) 5 MG immediate release tablet Take 5 mg by mouth every 4 (four) hours as needed for severe pain.    [provider]  QUETIAPINE FUMARATE PO Take by mouth.    [provider]  sertraline (ZOLOFT) 100 MG tablet Take 100 mg by mouth daily.    [provider]  Tiotropium Bromide Monohydrate 1.25 MCG/ACT AERS Inhale into the lungs.    [provider]  TRIAMCINOLONE PO Take by mouth.    [provider]     Critical care time: 50 mins       Posey Boyer, MSN, AG-ACNP-BC [facility] 11/26/2023, 6:58 PM  See Amion for pager If no response to pager , please call [phone] until 7pm After 7:00 pm call Elink  [phone]

## 2023-11-26 NOTE — Progress Notes (Signed)
Per neuro, LTM order requested, LTM hookup done, no skin breakdown. Head wrapped due to nature of order and symptoms. No glue used, neuro and RN agree.  ATRIUM NOTIFIED Hu charge captured

## 2023-11-26 NOTE — Progress Notes (Signed)
Per RN, pt moving to ICU, eeg to be conducted in new room as schedule allows.

## 2023-11-26 NOTE — Progress Notes (Signed)
EEG complete - results pending 

## 2023-11-26 NOTE — Progress Notes (Signed)
Pt transferred by [facility] from [facility] on MV. RT placed pt on MV once arrived to ED.

## 2023-11-26 NOTE — Progress Notes (Signed)
Transported pt from ed to Ct and then to [facility] ICU on vent with no incident

## 2023-11-26 NOTE — Progress Notes (Addendum)
PCCM Interval Note:  Continues to be very critically ill. Neuro exam is poor with no gag/cough, pupils dilated and fixed. She has severe acidemia with pH 6.6. bicarb 3. Potassium is elevated. Phos >30. I spoke with Dr. Arrie Aran with nephrology who evaluated the patient and deferred CRRT decision to PCCM and family discussion. I spoke with the sister, Alethea at bedside. She decided to proceed with HD cath placement, which was completed. CRRT is being started to hopefully correct her severe acidemia and electrolyte dysfunction.. She is still hypothermic and being actively warmed. Prognosis at this time is guarded. Will continue to re-evaluate neuro status with correction of above. I have relayed this to sister and godsons at bedside. I broached discussion of DNR if she were to code again. Sister would think about it. No decision at this time.

## 2023-11-26 NOTE — Progress Notes (Signed)
[facility] following code sepsis °

## 2023-11-26 NOTE — ED Notes (Signed)
Lactic acid > 9. EDP notified.

## 2023-11-26 NOTE — ED Provider Notes (Signed)
  Provider Note MRN:  [id number]  Arrival date & time: 11/26/23    ED Course and Medical Decision Making  Pt from [facility] as transfer Dr Suezanne Jacquet   See note from prior team for complete details, in brief:  73 year old female who presented to [facility] secondary to hypoglycemia.  AMS this morning, glucose was 50 per Dexcom.  Recent URI type symptoms While in the ER patient had bradycardia, cardiac arrest with ROSC She is on multiple vasopressors, she is on bicarb drip, vancomycin infusing Has right fem CVC She does not appear to be responding to noxious stimulus on exam, she has no gag, pupils are fixed and dilated.  She is not on sedation.  Discussed with PCCM NP at bedside, plan for admission to ICU    .Critical Care  Performed by: Sloan Leiter, DO Authorized by: Sloan Leiter, DO   Critical care provider statement:    Critical care time (minutes):  30   Critical care time was exclusive of:  Separately billable procedures and treating other patients   Critical care was necessary to treat or prevent imminent or life-threatening deterioration of the following conditions:  Cardiac failure, circulatory failure, respiratory failure and shock   Critical care was time spent personally by me on the following activities:  Discussions with consultants, evaluation of patient's response to treatment, examination of patient, pulse oximetry, re-evaluation of patient's condition and review of old charts   Care discussed with: admitting provider     Final Clinical Impressions(s) / ED Diagnoses     ICD-10-CM   1. Shock (HCC)  R57.9     2. Hypoglycemia  E16.2     3. Urinary tract infection without hematuria, site unspecified  N39.0     4. Cardiac arrest (HCC)  I46.9     5. Acute renal failure, unspecified acute renal failure type [facility])  N17.9       ED Discharge Orders     None       Discharge Instructions   None        Sloan Leiter, DO 11/26/23 1952

## 2023-11-26 NOTE — ED Provider Notes (Signed)
[place] EMERGENCY DEPARTMENT AT [facility] Provider Note  CSN: [id number] Arrival date & time: 11/26/23 1115  Chief Complaint(s) Hypoglycemia  HPI Vicki Murphy is a 73 y.o. female with history of diabetes, hypertension presenting to the emergency department with low blood sugar.  History is provided by the family.  The patient's sister and nephew.  They report that yesterday the patient was at her baseline, walk around the house, acting normally.  This morning the patient was very confused.  Her Dexcom reader showed blood glucose of 50.  She was very confused and they had trouble getting into the hospital.  After about 2 hours they were able to get her in the car and brought her in.  They report that she has maybe had some type of cold recently with cough.  Otherwise they are not aware of any recent differences in her behavior.  She does take insulin.   History limited due to altered mental status  Past Medical History Past Medical History:  Diagnosis Date   Arthritis    Diabetes mellitus    Hypertension    Patient Active Problem List   Diagnosis Date Noted   Leg weakness, bilateral 03/29/2014   Shoulder weakness 03/29/2014   Difficulty walking 03/29/2014   Home Medication(s) Prior to Admission medications   Medication Sig Start Date End Date Taking? Authorizing Provider  albuterol (VENTOLIN HFA) 108 (90 Base) MCG/ACT inhaler Inhale into the lungs. 10/29/23  Yes [provider]  chlorthalidone (HYGROTON) 25 MG tablet Take by mouth. 07/14/23  Yes [provider]  diclofenac Sodium (VOLTAREN) 1 % GEL Apply topically. 10/29/23  Yes [provider]  EPINEPHrine 0.3 mg/0.3 mL IJ SOAJ injection Inject into the muscle. 10/29/23  Yes [provider]  fluticasone-salmeterol (ADVAIR) 250-50 MCG/ACT AEPB Inhale into the lungs. 10/29/23  Yes [provider]  naloxone (NARCAN) nasal spray 4 mg/0.1 mL Place into the nose. 10/29/23   Yes [provider]  nitroGLYCERIN (NITROSTAT) 0.4 MG SL tablet Place under the tongue. 03/25/23  Yes [provider]  pregabalin (LYRICA) 200 MG capsule Take by mouth. 07/14/23  Yes [provider]  QUEtiapine (SEROQUEL) 200 MG tablet Take by mouth. 02/03/23  Yes [provider]  ALBUTEROL IN Inhale into the lungs.    [provider]  aspirin 81 MG tablet Take 81 mg by mouth daily.    [provider]  atenolol (TENORMIN) 50 MG tablet Take 50 mg by mouth daily.    [provider]  atorvastatin (LIPITOR) 40 MG tablet Take 40 mg by mouth daily.    [provider]  calcium acetate, Phos Binder, (PHOSLYRA) 667 MG/5ML SOLN Take by mouth 3 (three) times daily with meals.    [provider]  Clobetasol Prop Emollient Base 0.05 % emollient cream Apply topically 2 (two) times daily.    [provider]  cyclobenzaprine (FLEXERIL) 10 MG tablet Take 10 mg by mouth 3 (three) times daily as needed for muscle spasms.    [provider]  fluorometholone (FML) 0.1 % ophthalmic suspension 1 drop every 4 (four) hours.    [provider]  gabapentin (NEURONTIN) 300 MG capsule Take 1 capsule (300 mg total) by mouth 3 (three) times daily. 06/22/20   Vivi Barrack, DPM  glucose 4 GM chewable tablet Chew 1 tablet by mouth as needed for low blood sugar.    [provider]  hydrocerin (EUCERIN) CREA Apply 1 application topically 2 (two) times daily.  [provider]  hydrochlorothiazide (HYDRODIURIL) 25 MG tablet Take 25 mg by mouth daily.    [provider]  HYDROXYZINE HCL PO Take by mouth.    [provider]  INSULIN ASPART FLEXPEN [place] Inject into the skin.    [provider]  insulin detemir (LEVEMIR) 100 UNIT/ML injection Inject into the skin daily.    [provider]  LACTOBACILLUS PO Take by mouth.    [provider]  melatonin 3 MG TABS tablet  Take 3 mg by mouth at bedtime.    [provider]  metFORMIN (GLUCOPHAGE) 1000 MG tablet Take 1,000 mg by mouth 3 (three) times daily between meals.     [provider]  OMEPRAZOLE PO Take by mouth.    [provider]  oxyCODONE (ROXICODONE) 5 MG immediate release tablet Take 5 mg by mouth every 4 (four) hours as needed for severe pain.    [provider]  QUETIAPINE FUMARATE PO Take by mouth.    [provider]  sertraline (ZOLOFT) 100 MG tablet Take 100 mg by mouth daily.    [provider]  Tiotropium Bromide Monohydrate 1.25 MCG/ACT AERS Inhale into the lungs.    [provider]  TRIAMCINOLONE PO Take by mouth.    [provider]                                                                                                                                    Past Surgical History Past Surgical History:  Procedure Laterality Date   BREAST SURGERY     bio-psy negative   CARPAL TUNNEL RELEASE     right   JOINT REPLACEMENT  1992 and 2001   right knee   Family History History reviewed. No pertinent family history.  Social History Social History   Tobacco Use   Smoking status: Every Day    Types: Cigarettes   Smokeless tobacco: Never  Substance Use Topics   Alcohol use: No   Drug use: No   Allergies Morphine and codeine, Penicillins, and Tetanus-diphtheria toxoids td  Review of Systems Review of Systems  Unable to perform ROS: Acuity of condition    Physical Exam Vital Signs  I have reviewed the triage vital signs BP (!) 107/49   Pulse (!) 59   Temp (!) 90.7 F (32.6 C)   Resp (!) 30   Ht 6' (1.829 m)   Wt 95.3 kg   SpO2 97%   BMI 28.49 kg/m  Physical Exam Vitals and nursing note reviewed.  Constitutional:      General: She is in acute distress.     Appearance: She is obese. She is ill-appearing and diaphoretic.     Comments: Obtunded  HENT:     Head: Normocephalic and atraumatic.      Right Ear: External ear normal.     Left Ear: External ear normal.  Mouth/Throat:     Mouth: Mucous membranes are dry.  Eyes:     Pupils: Pupils are equal, round, and reactive to light.  Cardiovascular:     Rate and Rhythm: Normal rate and regular rhythm.     Pulses: Normal pulses.  Pulmonary:     Effort: No respiratory distress.     Breath sounds: Normal breath sounds.  Abdominal:     General: Abdomen is flat.     Palpations: Abdomen is soft.     Tenderness: There is no abdominal tenderness.  Musculoskeletal:        General: No swelling.     Cervical back: Neck supple.  Skin:    Comments: Cool   Neurological:     Mental Status: She is disoriented.  Psychiatric:     Comments: Unable to assess      ED Results and Treatments Labs (all labs ordered are listed, but only abnormal results are displayed) Labs Reviewed  COMPREHENSIVE METABOLIC PANEL - Abnormal; Notable for the following components:      Result Value   Sodium 131 (*)    Potassium 6.2 (*)    Chloride 96 (*)    CO2 <7 (*)    Glucose, Bld 179 (*)    BUN 80 (*)    Creatinine, Ser 6.98 (*)    Calcium 8.6 (*)    Total Protein 5.4 (*)    Albumin 2.5 (*)    GFR, Estimated 6 (*)    All other components within normal limits  CBC WITH DIFFERENTIAL/PLATELET - Abnormal; Notable for the following components:   Hemoglobin 10.7 (*)    MCHC 28.2 (*)    Monocytes Absolute 1.1 (*)    Abs Immature Granulocytes 0.31 (*)    All other components within normal limits  URINALYSIS, W/ REFLEX TO CULTURE (INFECTION SUSPECTED) - Abnormal; Notable for the following components:   Color, Urine AMBER (*)    APPearance CLOUDY (*)    Hgb urine dipstick SMALL (*)    Protein, ur 100 (*)    Leukocytes,Ua MODERATE (*)    Bacteria, UA MANY (*)    All other components within normal limits  LACTIC ACID, PLASMA - Abnormal; Notable for the following components:   Lactic Acid, Venous >9.0 (*)    All other components within normal limits   LACTIC ACID, PLASMA - Abnormal; Notable for the following components:   Lactic Acid, Venous >9.0 (*)    All other components within normal limits  BLOOD GAS, VENOUS - Abnormal; Notable for the following components:   pH, Ven <7.2 (*)    pCO2, Ven 29 (*)    pO2, Ven 31 (*)    All other components within normal limits  CBG MONITORING, ED - Abnormal; Notable for the following components:   Glucose-Capillary 34 (*)    All other components within normal limits  CBG MONITORING, ED - Abnormal; Notable for the following components:   Glucose-Capillary 209 (*)    All other components within normal limits  CBG MONITORING, ED - Abnormal; Notable for the following components:   Glucose-Capillary 179 (*)    All other components within normal limits  I-STAT CHEM 8, ED - Abnormal; Notable for the following components:   Sodium 129 (*)    Potassium 5.9 (*)    BUN 88 (*)    Creatinine, Ser 7.60 (*)    Glucose, Bld 158 (*)    Calcium, Ion 1.03 (*)    TCO2 6 (*)  Hemoglobin 11.9 (*)    HCT 35.0 (*)    All other components within normal limits  CBG MONITORING, ED - Abnormal; Notable for the following components:   Glucose-Capillary 122 (*)    All other components within normal limits  CBG MONITORING, ED - Abnormal; Notable for the following components:   Glucose-Capillary 121 (*)    All other components within normal limits  RESP PANEL BY RT-PCR (RSV, FLU A&B, COVID)  RVPGX2  CULTURE, BLOOD (ROUTINE X 2)  CULTURE, BLOOD (ROUTINE X 2)  URINE CULTURE  BLOOD GAS, ARTERIAL  BLOOD GAS, VENOUS                                                                                                                          Radiology DG Chest Port 1 View Result Date: 11/26/2023 CLINICAL DATA:  Post intubation. EXAM: PORTABLE CHEST 1 VIEW COMPARISON:  X-ray 06/16/2005 FINDINGS: Enteric tube in place with the tip extending beneath the diaphragm. ET tube in place with the tip seen proximally 6 cm above the  carina. This could be advanced 2 cm. Overlapping cardiac leads and defibrillator pads. Normal cardiopericardial silhouette. No pneumothorax or effusion. Diffuse bilateral interstitial changes. Acute process is possible. IMPRESSION: ET tube and enteric tube in place. ET tube seen 6 cm above the carina. This could be advanced 2 cm. Diffuse interstitial changes seen of the lungs. Edema versus infectious or inflammatory process is possible. Recommend follow up Electronically Signed   By: Karen Kays M.D.   On: 11/26/2023 13:33    Pertinent labs & imaging results that were available during my care of the patient were reviewed by me and considered in my medical decision making (see MDM for details).  Medications Ordered in ED Medications  lactated ringers infusion ( Intravenous New Bag/Given 11/26/23 1321)  aztreonam (AZACTAM) 2 g in sodium chloride 0.9 % 100 mL IVPB (has no administration in time range)  vancomycin (VANCOREADY) IVPB 2000 mg/400 mL (has no administration in time range)  EPINEPHrine (ADRENALIN) 5 mg in NS 250 mL (0.02 mg/mL) premix infusion (0.5 mcg/min Intravenous New Bag/Given 11/26/23 1234)  0.9 %  sodium chloride infusion (has no administration in time range)  norepinephrine (LEVOPHED) 4mg  in (0.016 mg/mL) premix infusion (2 mcg/min Intravenous New Bag/Given 11/26/23 1307)  lactated ringers bolus 1,000 mL (has no administration in time range)  vasopressin (PITRESSIN) 20 Units in 100 mL (0.2 unit/mL) infusion-*FOR SHOCK* (has no administration in time range)  lactated ringers bolus 1,000 mL (has no administration in time range)  dextrose 50 % solution 50 mL (50 mLs Intravenous Given 11/26/23 1130)  sodium chloride 0.9 % bolus 1,000 mL (1,000 mLs Intravenous Bolus 11/26/23 1148)  metroNIDAZOLE (FLAGYL) IVPB 500 mg (500 mg Intravenous New Bag/Given 11/26/23 1250)  calcium gluconate 10 % injection (  Given 11/26/23 1235)  rocuronium (ZEMURON) 100 MG/10ML injection (  Given 11/26/23 1246)   etomidate (AMIDATE) 2 MG/ML injection (  Given 11/26/23 1245)  sodium bicarbonate 150 mEq in dextrose 5 % 1,150 mL infusion ( Intravenous New Bag/Given 11/26/23 1429)                                                                                                                                     Procedures .Critical Care  Performed by: [name], MD Authorized by: [name], MD   Critical care provider statement:    Critical care time (minutes):  75   Critical care time was exclusive of:  Separately billable procedures and treating other patients   Critical care was necessary to treat or prevent imminent or life-threatening deterioration of the following conditions:  Shock, cardiac failure and circulatory failure   Critical care was time spent personally by me on the following activities:  Development of treatment plan with patient or surrogate, discussions with consultants, evaluation of patient's response to treatment, examination of patient, ordering and review of laboratory studies, ordering and review of radiographic studies, ordering and performing treatments and interventions, pulse oximetry, re-evaluation of patient's condition and review of old charts   Care discussed with: accepting provider at another facility   Procedure Name: Intubation Date/Time: 11/26/2023 4:08 PM  Performed by: [name], MDPre-anesthesia Checklist: Patient identified Oxygen Delivery Method: Ambu bag Preoxygenation: Pre-oxygenation with 100% oxygen Induction Type: Rapid sequence Laryngoscope Size: Mac and 4 Grade View: Grade I Tube size: 7.5 mm Number of attempts: 1 Airway Equipment and Method: Video-laryngoscopy Placement Confirmation: ETT inserted through vocal cords under direct vision, Positive ETCO2, Breath sounds checked- equal and bilateral and CO2 detector Secured at: 23 cm    CPR  Date/Time: 11/26/2023 4:09 PM  Performed by: [name], MD Authorized  by: [name], MD  CPR Procedure Details:      Amount of time prior to administration of ACLS/BLS (minutes):  0   ACLS/BLS initiated by EMS: No     CPR/ACLS performed in the ED: Yes     Duration of CPR (minutes):  4   Outcome: ROSC obtained    CPR performed via ACLS guidelines under my direct supervision.  See RN documentation for details including defibrillator use, medications, doses and timing. Central Line  Date/Time: 11/26/2023 4:09 PM  Performed by: [name], MD Authorized by: [name], MD   Consent:    Consent obtained:  Emergent situation Universal protocol:    Patient identity confirmed:  Arm band and provided demographic data Pre-procedure details:    Indication(s): central venous access and insufficient peripheral access     Hand hygiene: Hand hygiene performed prior to insertion     Sterile barrier technique: All elements of maximal sterile technique followed     Skin preparation:  Chlorhexidine with alcohol   Skin preparation agent: Skin preparation agent completely dried prior to procedure   Procedure details:    Location:  R femoral   Patient position:  Supine  Procedural supplies:  Triple lumen   Landmarks identified: yes     Ultrasound guidance: yes     Ultrasound guidance timing: prior to insertion and real time     Sterile ultrasound techniques: Sterile gel and sterile probe covers were used     Number of attempts:  1   Successful placement: yes   Post-procedure details:    Post-procedure:  Dressing applied and line sutured   Assessment:  Blood return through all ports and free fluid flow   Procedure completion:  Tolerated well, no immediate complications   (including critical care time)  Medical Decision Making / ED Course   MDM:  73 year old female presenting to the emergency department with hypoglycemia.  Patient very ill-appearing on arrival, obtunded, with glucose of 34.  She received IV glucose with improvement  to 200.  However, patient remained obtunded.  She became more lethargic.  Patient was also noted to be more bradycardic.  Unclear what initial insult was that led to patient's hypoglycemia, hypothermia, shock state, consider infection, dehydration, recent illness.  Laboratory testing concerning for new onset renal failure and patient appears extremely dehydrated.  Laboratory testing also notable for severe acidosis.  Given bradycardia patient was given calcium. Potassium is slightly elevated at 6.2. Patient felt progressive bradycardia and lost pulses,received 2 rounds of CPR with 2 mg of epi, bicarbonate amp given as well.  Suspect primary insult resulting in CPR was severe acidosis.  Pulses were regained and patient was intubated after this.  Patient has remained in a state of shock with lactate greater than 9 on initial and repeat lactate.  She was started on epinephrine due to bradycardia and norepinephrine was added due to hypotension.  Patient started on bicarb drip given her severe acidosis.  Patient was noted to be hypothermic with a temperature of 88.2 on arrival and active rewarming was initiated with [employer]  Temp Foley was placed and patient also has undergone bladder irrigation with warm fluids.  Patient has been covered with broad-spectrum antibiotics, urinalysis is suspicious for UTI.  CT scans have been ordered for further evaluation of any underlying process including intracranial injury, intra-abdominal infection, pneumonia, and are pending due to patient's instability.  Patient has had slow improvement in her temperature as well as slow improvement in her blood pressure.  Vasopressin has been ordered but has not needed to be given yet.  Given the patient's state of critical illness discussed with ICU doctor Dr. Judeth Horn.  There are no beds available [facility].  He recommended that we transfer the patient ER to ER  if her blood pressure remained stable at 65 while on pressors.  Patient's blood  pressure has been stable.  A central line has been placed.  Discussed with Dr. Rush Landmark at [facility] who has accepted the patient for transfer.  Discussed workup with the patient's family including critical illness.  Patient is severely ill and remains at a high risk of death.  Prognosis is poor.  Discussed consideration of comfort care if patient were to have further decompensation.  Family are still considering this.  Clinical Course as of 11/26/23 1619  Thu Nov 26, 2023  1146 Resp panel by RT-PCR (RSV, Flu A&B, Covid) Anterior Nasal Swab [WS]    Clinical Course User Index [WS] [name], MD     Additional history obtained: -Additional history obtained from family    Lab Tests: -I ordered, reviewed, and interpreted labs.   The pertinent results include:   Labs Reviewed  COMPREHENSIVE METABOLIC PANEL - Abnormal; Notable for the following components:      Result Value   Sodium 131 (*)    Potassium 6.2 (*)    Chloride 96 (*)    CO2 <7 (*)    Glucose, Bld 179 (*)    BUN 80 (*)    Creatinine, Ser 6.98 (*)    Calcium 8.6 (*)    Total Protein 5.4 (*)    Albumin 2.5 (*)    GFR, Estimated 6 (*)    All other components within normal limits  CBC WITH DIFFERENTIAL/PLATELET - Abnormal; Notable for the following components:   Hemoglobin 10.7 (*)    MCHC 28.2 (*)    Monocytes Absolute 1.1 (*)    Abs Immature Granulocytes 0.31 (*)    All other components within normal limits  URINALYSIS, W/ REFLEX TO CULTURE (INFECTION SUSPECTED) - Abnormal; Notable for the following components:   Color, Urine AMBER (*)    APPearance CLOUDY (*)    Hgb urine dipstick SMALL (*)    Protein, ur 100 (*)    Leukocytes,Ua MODERATE (*)    Bacteria, UA MANY (*)    All other components within normal limits  LACTIC ACID, PLASMA - Abnormal; Notable for the following components:   Lactic Acid, Venous >9.0 (*)    All other components within normal limits  LACTIC ACID, PLASMA - Abnormal; Notable for  the following components:   Lactic Acid, Venous >9.0 (*)    All other components within normal limits  BLOOD GAS, VENOUS - Abnormal; Notable for the following components:   pH, Ven <7.2 (*)    pCO2, Ven 29 (*)    pO2, Ven 31 (*)    All other components within normal limits  CBG MONITORING, ED - Abnormal; Notable for the following components:   Glucose-Capillary 34 (*)    All other components within normal limits  CBG MONITORING, ED - Abnormal; Notable for the following components:   Glucose-Capillary 209 (*)    All other components within normal limits  CBG MONITORING, ED - Abnormal; Notable for the following components:   Glucose-Capillary 179 (*)    All other components within normal limits  I-STAT CHEM 8, ED - Abnormal; Notable for the following components:   Sodium 129 (*)    Potassium 5.9 (*)    BUN 88 (*)    Creatinine, Ser 7.60 (*)    Glucose, Bld 158 (*)    Calcium, Ion 1.03 (*)    TCO2 6 (*)    Hemoglobin 11.9 (*)    HCT 35.0 (*)    All other components within normal limits  CBG MONITORING, ED - Abnormal; Notable for the following components:   Glucose-Capillary 122 (*)    All other components within normal limits  CBG MONITORING, ED - Abnormal; Notable for the following components:   Glucose-Capillary 121 (*)    All other components within normal limits  RESP PANEL BY RT-PCR (RSV, FLU A&B, COVID)  RVPGX2  CULTURE, BLOOD (ROUTINE X 2)  CULTURE, BLOOD (ROUTINE X 2)  URINE CULTURE  BLOOD GAS, ARTERIAL  BLOOD GAS, VENOUS    Notable for lactic acidosis, hypoglycemia, renal failure, hyperkalemia, acidosis, signs of UTI.  EKG   EKG Interpretation Date/Time:  Thursday November 26 2023 12:26:29 EST Ventricular Rate:  25 PR Interval:    QRS Duration:  207 QT Interval:  679 QTC Calculation: 438 R Axis:   -30  Text Interpretation: Marked sinus bradycardia Right bundle branch block  Repol abnrm suggests ischemia, lateral leads Confirmed by Alvino Blood [phone]) on  11/26/2023 4:18:15 PM         Imaging Studies ordered: I ordered imaging studies including CXR On my interpretation imaging demonstrates ETT needs advancing I independently visualized and interpreted imaging. I agree with the radiologist interpretation   Medicines ordered and prescription drug management: Meds ordered this encounter  Medications   dextrose 50 % solution    Primitivo Gauze, Marylene Land K: cabinet override   dextrose 50 % solution 50 mL   sodium chloride 0.9 % bolus 1,000 mL   lactated ringers infusion   aztreonam (AZACTAM) 2 g in sodium chloride 0.9 % 100 mL IVPB    Antibiotic Indication::   Other Indication (list below)    Other Indication::   Unknown Source.   metroNIDAZOLE (FLAGYL) IVPB 500 mg    Antibiotic Indication::   Other Indication (list below)    Other Indication::   Unknown Source.   DISCONTD: vancomycin (VANCOCIN) IVPB 1000 mg/200 mL premix    Indication::   Other Indication (list below)    Other Indication::   Unknown Source.   vancomycin (VANCOREADY) IVPB 2000 mg/400 mL    Indication::   Other Indication (list below)    Other Indication::   Unknown Source.   calcium gluconate 10 % injection    Ether Griffins, Deanna N: cabinet override   EPINEPHrine NaCl 5-0.9 MG/250ML-% premix infusion    Ether Griffins, Deanna N: cabinet override   EPINEPHrine (ADRENALIN) 5 mg in NS 250 mL (0.02 mg/mL) premix infusion   rocuronium (ZEMURON) 100 MG/10ML injection    Rosanna Randy K: cabinet override   etomidate (AMIDATE) 2 MG/ML injection    Rosanna Randy K: cabinet override   sodium bicarbonate 150 mEq in dextrose 5 % 1,150 mL infusion    Sodium Bicarbonate 150 mEq added to 1L D5W = 480 mOsm/L (effective osmolarity: 261 mOsm/L).   0.9 %  sodium chloride infusion   norepinephrine (LEVOPHED) 4mg  in (0.016 mg/mL) premix infusion    IV Access:   Peripheral   lactated ringers bolus 1,000 mL   vasopressin (PITRESSIN) 20 Units in 100 mL (0.2 unit/mL) infusion-*FOR SHOCK*    lactated ringers bolus 1,000 mL    -I have reviewed the patients home medicines and have made adjustments as needed   Consultations Obtained: I requested consultation with the intensivist,  and discussed lab and imaging findings as well as pertinent plan - they recommend: continue care for critical illness, [facility]    Cardiac Monitoring: The patient was maintained on a cardiac monitor.  I personally viewed and interpreted the cardiac monitored which showed an underlying rhythm of: sinus bradycardia    Reevaluation: After the interventions noted above, I reevaluated the patient and found that their symptoms have improved  Co morbidities that complicate the patient evaluation  Past Medical History:  Diagnosis Date   Arthritis    Diabetes mellitus    Hypertension       Dispostion: Disposition decision including need for hospitalization was considered, and patient transferred.    Final Clinical Impression(s) / ED Diagnoses Final diagnoses:  Shock (HCC)  Hypoglycemia  Urinary tract infection without hematuria, site unspecified  Cardiac arrest (HCC)  Acute renal failure, unspecified acute renal failure type [facility])     This chart was dictated using voice recognition software.  Despite best efforts to proofread,  errors can occur which can change the documentation meaning.    [name], MD 11/26/23 1620

## 2023-11-26 NOTE — Progress Notes (Signed)
Spoke with sister, Spero Geralds [phone].  Pt lives with her and her son. Was or is possibly married but sister reports they do not know where he is or "if he is alive".  No children and parents are deceased.  Gets care from [place].   Was in her normal state of health yesterday.  LSN 0300 this am.  Reports pt was vomiting this morning, although she sister not see any vomiting.  Family thought she was dehydrated and hypoglycemic b/c pt took her DM meds, they tried to treat with OJ and peanut butter but became progressive confused and fell x 2 at home.    Discussed pt's critical illness, shock, and MODS with concern for decline, possible re-arrest and concern for neuro insult.  Sister is unsure of her wishes/ GOC and wishes to continue full support for now pending further workup.        Posey Boyer, MSN, AG-ACNP-BC [facility] 11/26/2023, 7:32 PM  See Amion for pager If no response to pager , please call [phone] until 7pm After 7:00 pm call Elink  [phone]

## 2023-11-26 NOTE — Procedures (Signed)
Central Venous Catheter Insertion Procedure Note  Alaijah Jindal  [id number]  Jul 17, 1951  Date:11/26/23  Time:10:42 PM   Provider Performing:Kinlee Garrison Bea Laura  Cherlynn Polo   Procedure: Insertion of Non-tunneled Central Venous Catheter(36556)with US guidance (74259)    Indication(s) Hemodialysis  Consent Risks of the procedure as well as the alternatives and risks of each were explained to the patient and/or caregiver.  Consent for the procedure was obtained and is signed in the bedside chart  Anesthesia Topical only with 1% lidocaine   Timeout Verified patient identification, verified procedure, site/side was marked, verified correct patient position, special equipment/implants available, medications/allergies/relevant history reviewed, required imaging and test results available.  Sterile Technique Maximal sterile technique including full sterile barrier drape, hand hygiene, sterile gown, sterile gloves, mask, hair covering, sterile ultrasound probe cover (if used).  Procedure Description Area of catheter insertion was cleaned with chlorhexidine and draped in sterile fashion.   With real-time ultrasound guidance a HD catheter was placed into the left internal jugular vein.  Nonpulsatile blood flow and easy flushing noted in all ports.  The catheter was sutured in place and sterile dressing applied.  Complications/Tolerance None; patient tolerated the procedure well. Chest X-ray is ordered to verify placement for internal jugular or subclavian cannulation.  Chest x-ray is not ordered for femoral cannulation.  EBL Minimal  Specimen(s) None   [name], PA-C [facility] 11/26/23 10:42 PM  Please see [email or web address] for pager details.  From 7A-7P if no response, please call [phone] After hours, please call ELink [phone]

## 2023-11-26 NOTE — Significant Event (Signed)
   PCCM transfer request    Sending physician: Dr. Suezanne Jacquet  Sending facility: [facility]  Reason for transfer: shock  Brief case summary:  73 year old woman presented with fairly quick decline, found to be hypotensive bradycardic possible brief PEA arrest now intubated on vasopressors.  Remains bradycardic despite epinephrine and norepinephrine.  Escalating doses.  pH is undetectably low, pCO2 in the 20s lactate 9, largely metabolic.  Given broad spectrum antibiotics.  Ongoing fluid resuscitation  Recommendations made prior to transfer: -- Continue to titrate pressors -- Can add vasopressin -- Increase respiratory rate to 30, already on 8 cc/kg, repeat ABG, target pH at least 7.15 prior to transport --Continue bicarbonate drip, consider discontinuation once pH is above 7.1 -- Need MAP of 65 on support as well as a pH of at least 7.15 before attempting transport -- If unable to demonstrate improvement in either pH or blood pressure with additional support, recommend ongoing goals of care discussion with family and consider withdrawal of care if not improving with escalating efforts  Transfer accepted: yes condition on parameters above allowing patient to be safely transported, likely would need ED to ED transfer given no beds currently available    Vicki Murphy [facility] 11/26/23 1:37 PM [facility]  For contact information, see Amion. If no response to pager, please call PCCM consult pager. After hours, 7PM- 7AM, please call Elink.

## 2023-11-27 ENCOUNTER — Inpatient Hospital Stay (HOSPITAL_COMMUNITY): Payer: No Typology Code available for payment source

## 2023-11-27 DIAGNOSIS — R569 Unspecified convulsions: Secondary | ICD-10-CM | POA: Diagnosis not present

## 2023-11-27 DIAGNOSIS — I469 Cardiac arrest, cause unspecified: Secondary | ICD-10-CM

## 2023-11-27 LAB — POCT I-STAT 7, (LYTES, BLD GAS, ICA,H+H)
Acid-base deficit: 30 mmol/L — ABNORMAL HIGH (ref 0.0–2.0)
Acid-base deficit: 27 mmol/L — ABNORMAL HIGH (ref 0.0–2.0)
Acid-base deficit: 26 mmol/L — ABNORMAL HIGH (ref 0.0–2.0)
Acid-base deficit: 12 mmol/L — ABNORMAL HIGH (ref 0.0–2.0)
Bicarbonate: 5.1 mmol/L — ABNORMAL LOW (ref 20.0–28.0)
Bicarbonate: 6.4 mmol/L — ABNORMAL LOW (ref 20.0–28.0)
Bicarbonate: 6.5 mmol/L — ABNORMAL LOW (ref 20.0–28.0)
Bicarbonate: 14.4 mmol/L — ABNORMAL LOW (ref 20.0–28.0)
Calcium, Ion: 0.95 mmol/L — ABNORMAL LOW (ref 1.15–1.40)
Calcium, Ion: 0.93 mmol/L — ABNORMAL LOW (ref 1.15–1.40)
Calcium, Ion: 0.92 mmol/L — ABNORMAL LOW (ref 1.15–1.40)
Calcium, Ion: 0.85 mmol/L — CL (ref 1.15–1.40)
HCT: 37 % (ref 36.0–46.0)
HCT: 42 % (ref 36.0–46.0)
HCT: 43 % (ref 36.0–46.0)
HCT: 33 % — ABNORMAL LOW (ref 36.0–46.0)
Hemoglobin: 12.6 g/dL (ref 12.0–15.0)
Hemoglobin: 14.3 g/dL (ref 12.0–15.0)
Hemoglobin: 14.6 g/dL (ref 12.0–15.0)
Hemoglobin: 11.2 g/dL — ABNORMAL LOW (ref 12.0–15.0)
O2 Saturation: 88 %
O2 Saturation: 85 %
O2 Saturation: 87 %
O2 Saturation: 91 %
Patient temperature: 36.3
Patient temperature: 37.1
Patient temperature: 37.1
Patient temperature: 98.6
Potassium: 5.5 mmol/L — ABNORMAL HIGH (ref 3.5–5.1)
Potassium: 4.9 mmol/L (ref 3.5–5.1)
Potassium: 4.5 mmol/L (ref 3.5–5.1)
Potassium: 3.7 mmol/L (ref 3.5–5.1)
Sodium: 129 mmol/L — ABNORMAL LOW (ref 135–145)
Sodium: 127 mmol/L — ABNORMAL LOW (ref 135–145)
Sodium: 130 mmol/L — ABNORMAL LOW (ref 135–145)
Sodium: 127 mmol/L — ABNORMAL LOW (ref 135–145)
TCO2: 6 mmol/L — ABNORMAL LOW (ref 22–32)
TCO2: 7 mmol/L — ABNORMAL LOW (ref 22–32)
TCO2: 7 mmol/L — ABNORMAL LOW (ref 22–32)
TCO2: 15 mmol/L — ABNORMAL LOW (ref 22–32)
pCO2 arterial: 34.5 mmHg (ref 32–48)
pCO2 arterial: 37.3 mm[Hg] (ref 32–48)
pCO2 arterial: 32.7 mm[Hg] (ref 32–48)
pCO2 arterial: 31.8 mm[Hg] — ABNORMAL LOW (ref 32–48)
pH, Arterial: 6.775 — CL (ref 7.35–7.45)
pH, Arterial: 6.84 — CL (ref 7.35–7.45)
pH, Arterial: 6.906 — CL (ref 7.35–7.45)
pH, Arterial: 7.264 — ABNORMAL LOW (ref 7.35–7.45)
pO2, Arterial: 101 mmHg (ref 83–108)
pO2, Arterial: 89 mm[Hg] (ref 83–108)
pO2, Arterial: 86 mm[Hg] (ref 83–108)
pO2, Arterial: 70 mm[Hg] — ABNORMAL LOW (ref 83–108)

## 2023-11-27 LAB — CBC
HCT: 44.1 % (ref 36.0–46.0)
Hemoglobin: 13.2 g/dL (ref 12.0–15.0)
MCH: 26.2 pg (ref 26.0–34.0)
MCHC: 29.9 g/dL — ABNORMAL LOW (ref 30.0–36.0)
MCV: 87.7 fL (ref 80.0–100.0)
Platelets: 284 10*3/uL (ref 150–400)
RBC: 5.03 MIL/uL (ref 3.87–5.11)
RDW: 15.3 % (ref 11.5–15.5)
WBC: 19.8 10*3/uL — ABNORMAL HIGH (ref 4.0–10.5)
nRBC: 0.8 % — ABNORMAL HIGH (ref 0.0–0.2)

## 2023-11-27 LAB — COMPREHENSIVE METABOLIC PANEL
ALT: 726 U/L — ABNORMAL HIGH (ref 0–44)
AST: 1437 U/L — ABNORMAL HIGH (ref 15–41)
Albumin: 2.4 g/dL — ABNORMAL LOW (ref 3.5–5.0)
Alkaline Phosphatase: 119 U/L (ref 38–126)
BUN: 64 mg/dL — ABNORMAL HIGH (ref 8–23)
CO2: <7 mmol/L — ABNORMAL LOW (ref 22–32)
Calcium: 7.4 mg/dL — ABNORMAL LOW (ref 8.9–10.3)
Chloride: 93 mmol/L — ABNORMAL LOW (ref 98–111)
Creatinine, Ser: 5.54 mg/dL — ABNORMAL HIGH (ref 0.44–1.00)
GFR, Estimated: 8 mL/min — ABNORMAL LOW (ref >60)
Glucose, Bld: 148 mg/dL — ABNORMAL HIGH (ref 70–99)
Potassium: 5.1 mmol/L (ref 3.5–5.1)
Sodium: 133 mmol/L — ABNORMAL LOW (ref 135–145)
Total Bilirubin: 1.9 mg/dL — ABNORMAL HIGH (ref 0.0–1.2)
Total Protein: 5.2 g/dL — ABNORMAL LOW (ref 6.5–8.1)

## 2023-11-27 LAB — ECHOCARDIOGRAM COMPLETE
AR max vel: 2.59 cm2
AV Area VTI: 3.11 cm2
AV Area mean vel: 2.61 cm2
AV Mean grad: 3 mm[Hg]
AV Peak grad: 7.4 mm[Hg]
Ao pk vel: 1.36 m/s
Area-P 1/2: 2.41 cm2
Height: 72 in
S' Lateral: 2.2 cm
Weight: 3569.69 [oz_av]

## 2023-11-27 LAB — RENAL FUNCTION PANEL
Albumin: 2.5 g/dL — ABNORMAL LOW (ref 3.5–5.0)
Anion gap: 31 — ABNORMAL HIGH (ref 5–15)
BUN: 45 mg/dL — ABNORMAL HIGH (ref 8–23)
CO2: 10 mmol/L — ABNORMAL LOW (ref 22–32)
Calcium: 6.8 mg/dL — ABNORMAL LOW (ref 8.9–10.3)
Chloride: 90 mmol/L — ABNORMAL LOW (ref 98–111)
Creatinine, Ser: 3.8 mg/dL — ABNORMAL HIGH (ref 0.44–1.00)
GFR, Estimated: 12 mL/min — ABNORMAL LOW (ref >60)
Glucose, Bld: 153 mg/dL — ABNORMAL HIGH (ref 70–99)
Phosphorus: 5.9 mg/dL — ABNORMAL HIGH (ref 2.5–4.6)
Potassium: 4.2 mmol/L (ref 3.5–5.1)
Sodium: 131 mmol/L — ABNORMAL LOW (ref 135–145)

## 2023-11-27 LAB — BLOOD GAS, ARTERIAL
Acid-base deficit: 23.8 mmol/L — ABNORMAL HIGH (ref 0.0–2.0)
Acid-base deficit: 15.6 mmol/L — ABNORMAL HIGH (ref 0.0–2.0)
Bicarbonate: 6.5 mmol/L — ABNORMAL LOW (ref 20.0–28.0)
Bicarbonate: 10.8 mmol/L — ABNORMAL LOW (ref 20.0–28.0)
Drawn by: 50336
O2 Saturation: 95.4 %
O2 Saturation: 95.7 %
Patient temperature: 36.2
Patient temperature: 37.8
pCO2 arterial: 26 mm[Hg] — ABNORMAL LOW (ref 32–48)
pCO2 arterial: 28 mm[Hg] — ABNORMAL LOW (ref 32–48)
pH, Arterial: 7 — CL (ref 7.35–7.45)
pH, Arterial: 7.2 — ABNORMAL LOW (ref 7.35–7.45)
pO2, Arterial: 79 mm[Hg] — ABNORMAL LOW (ref 83–108)
pO2, Arterial: 77 mm[Hg] — ABNORMAL LOW (ref 83–108)

## 2023-11-27 LAB — CARBOXYHEMOGLOBIN - COOX: Carboxyhemoglobin: 0.5 % (ref 0.5–1.5)

## 2023-11-27 LAB — GLUCOSE, CAPILLARY
Glucose-Capillary: 189 mg/dL — ABNORMAL HIGH (ref 70–99)
Glucose-Capillary: 170 mg/dL — ABNORMAL HIGH (ref 70–99)
Glucose-Capillary: 172 mg/dL — ABNORMAL HIGH (ref 70–99)
Glucose-Capillary: 138 mg/dL — ABNORMAL HIGH (ref 70–99)
Glucose-Capillary: 156 mg/dL — ABNORMAL HIGH (ref 70–99)
Glucose-Capillary: 153 mg/dL — ABNORMAL HIGH (ref 70–99)

## 2023-11-27 LAB — MAGNESIUM: Magnesium: 1.8 mg/dL (ref 1.7–2.4)

## 2023-11-27 LAB — LACTIC ACID, PLASMA
Lactic Acid, Venous: >9 mmol/L (ref 0.5–1.9)
Lactic Acid, Venous: >9 mmol/L (ref 0.5–1.9)
Lactic Acid, Venous: >9 mmol/L (ref 0.5–1.9)

## 2023-11-27 LAB — PHOSPHORUS: Phosphorus: 11.1 mg/dL — ABNORMAL HIGH (ref 2.5–4.6)

## 2023-11-27 LAB — APTT: aPTT: 75 s — ABNORMAL HIGH (ref 24–36)

## 2023-11-27 MED ORDER — SODIUM CHLORIDE 0.9 % IV SOLN
1.0000 g | Freq: Three times a day (TID) | INTRAVENOUS | Status: DC
  Administered 2023-11-27 – 2023-11-28 (×3): 1 g via INTRAVENOUS
  Filled 2023-11-27 (×6): qty 5

## 2023-11-27 MED ORDER — FENTANYL 2500MCG IN NS 250ML (10MCG/ML) PREMIX INFUSION
0.0000 ug/h | INTRAVENOUS | Status: DC
  Administered 2023-11-28: 25 ug/h via INTRAVENOUS
  Filled 2023-11-27: qty 250

## 2023-11-27 MED ORDER — PERFLUTREN LIPID MICROSPHERE
1.0000 mL | INTRAVENOUS | Status: DC | PRN
  Administered 2023-11-27: 3 mL via INTRAVENOUS

## 2023-11-27 MED ORDER — FENTANYL CITRATE PF 50 MCG/ML IJ SOSY
25.0000 ug | PREFILLED_SYRINGE | INTRAMUSCULAR | Status: DC | PRN
  Administered 2023-11-27 – 2023-11-28 (×3): 50 ug via INTRAVENOUS
  Filled 2023-11-27: qty 1

## 2023-11-27 MED ORDER — INSULIN ASPART 100 UNIT/ML IJ SOLN
0.0000 [IU] | INTRAMUSCULAR | Status: DC
  Administered 2023-11-27: 3 [IU] via SUBCUTANEOUS
  Administered 2023-11-27: 2 [IU] via SUBCUTANEOUS
  Administered 2023-11-27: 3 [IU] via SUBCUTANEOUS

## 2023-11-27 MED ORDER — FENTANYL CITRATE PF 50 MCG/ML IJ SOSY: 12.5000 ug | PREFILLED_SYRINGE | INTRAMUSCULAR | Status: DC | PRN

## 2023-11-27 MED ORDER — SODIUM CHLORIDE 0.9 % IV SOLN
2.0000 g | Freq: Three times a day (TID) | INTRAVENOUS | Status: DC
  Filled 2023-11-27: qty 10

## 2023-11-27 MED ORDER — LEVOTHYROXINE SODIUM 25 MCG PO TABS
50.0000 ug | ORAL_TABLET | Freq: Every day | ORAL | Status: DC
  Administered 2023-11-28: 50 ug
  Filled 2023-11-27: qty 2

## 2023-11-27 MED ORDER — CALCIUM GLUCONATE-NACL 1-0.675 GM/50ML-% IV SOLN
1.0000 g | Freq: Once | INTRAVENOUS | Status: AC
  Administered 2023-11-27: 1000 mg via INTRAVENOUS
  Filled 2023-11-27: qty 50

## 2023-11-27 MED ORDER — CALCIUM GLUCONATE-NACL 1-0.675 GM/50ML-% IV SOLN
1.0000 g | Freq: Once | INTRAVENOUS | Status: AC
Start: 2023-11-27 — End: 2023-11-27
  Administered 2023-11-27: 1000 mg via INTRAVENOUS
  Filled 2023-11-27: qty 50

## 2023-11-27 MED ORDER — RENA-VITE PO TABS: 1.0000 | ORAL_TABLET | Freq: Every day | ORAL | Status: DC

## 2023-11-27 MED ORDER — ALBUMIN HUMAN 5 % IV SOLN
25.0000 g | Freq: Once | INTRAVENOUS | Status: AC
Start: 2023-11-27 — End: 2023-11-27
  Administered 2023-11-27: 25 g via INTRAVENOUS
  Filled 2023-11-27: qty 500

## 2023-11-27 MED ORDER — HEPARIN SODIUM (PORCINE) 5000 UNIT/ML IJ SOLN
5000.0000 [IU] | Freq: Three times a day (TID) | INTRAMUSCULAR | Status: DC
  Administered 2023-11-27 – 2023-11-28 (×3): 5000 [IU] via SUBCUTANEOUS
  Filled 2023-11-27 (×3): qty 1

## 2023-11-27 MED ORDER — PANTOPRAZOLE SODIUM 40 MG IV SOLR
40.0000 mg | INTRAVENOUS | Status: DC
Start: 2023-11-27 — End: 2023-11-29
  Administered 2023-11-27: 40 mg via INTRAVENOUS
  Filled 2023-11-27: qty 10

## 2023-11-27 MED ORDER — ALBUMIN HUMAN 5 % IV SOLN
INTRAVENOUS | Status: AC
  Filled 2023-11-27: qty 250

## 2023-11-27 MED ORDER — CHLORHEXIDINE GLUCONATE CLOTH 2 % EX PADS
6.0000 | MEDICATED_PAD | Freq: Every day | CUTANEOUS | Status: DC
  Administered 2023-11-27: 6 via TOPICAL

## 2023-11-27 MED ORDER — HYDROCORTISONE SOD SUC (PF) 100 MG IJ SOLR
100.0000 mg | Freq: Two times a day (BID) | INTRAMUSCULAR | Status: DC
  Administered 2023-11-27 – 2023-11-28 (×3): 100 mg via INTRAVENOUS
  Filled 2023-11-27 (×3): qty 2

## 2023-11-27 NOTE — Procedures (Addendum)
Patient Name: [name]  MRN: [id number]  Epilepsy Attending: Charlsie Quest  Referring Physician/Provider: Erick Blinks, MD  Duration: 11/26/2023 2145 to 11/27/2023 2145   Patient history: 73yo F s/p cardiac arrest getting eeg to evaluate for seizure   Level of alertness: comatose   AEDs during EEG study: None   Technical aspects: This EEG study was done with scalp electrodes positioned according to the 10-20 International system of electrode placement. Electrical activity was reviewed with band pass filter of 1-70Hz , sensitivity of 7 uV/mm, display speed of 106mm/sec with a 60Hz  notched filter applied as appropriate. EEG data were recorded continuously and digitally stored.  Video monitoring was available and reviewed as appropriate.   Description: EEG showed continuous generalized low amplitude 3 to 6 Hz theta-delta slowing. Hyperventilation and photic stimulation were not performed.     Event button was pressed on 11/27/2023 at 0229 for facial twitching per RN ( unable to see on camera). Concomitant EEG before, during and after the event did not show any EEG to suggest seizure.   ABNORMALITY - Continuous slow, generalized   IMPRESSION: This study is suggestive of severe diffuse encephalopathy. No seizures or epileptiform discharges were seen throughout the recording.  Event button was pressed on 11/27/2023 at 0229 for facial twitching without concomitant EEG change.  However focal motor seizures may not be seen on scalp EEG.  Clinical correlation is recommended.   Neviah Braud [name]

## 2023-11-27 NOTE — Progress Notes (Signed)
Maint done, no skin breakdown

## 2023-11-27 NOTE — Progress Notes (Addendum)
Initial Nutrition Assessment  DOCUMENTATION CODES:  Non-severe (moderate) malnutrition in context of social or environmental circumstances  INTERVENTION:  When pt more hemodynamically stable, recommend initiation of enteral feeds via OGT: Vital 1.5 at 55 ml/h (1320 ml per day) Start at 85mL/h and advance by 10mL q12h to goal Prosource TF20 60 ml 1x/d Provides 2060 kcal, 109 gm protein, 1008 ml free water daily Renavite daily for micronutrient needs associated with CRRT  NUTRITION DIAGNOSIS:  Moderate Malnutrition related to social / environmental circumstances (inadequate oral intake) as evidenced by mild fat depletion, mild muscle depletion.  GOAL:  Patient will meet greater than or equal to 90% of their needs  MONITOR:  I & O's, Vent status, Labs, Weight trends  REASON FOR ASSESSMENT:  Consult Assessment of nutrition requirement/status (CRRT)  ASSESSMENT:  Pt with hx of DM type 2, GERD, HLD, and HTN presented to ED with hypoglycemia, AMS, and URI symptoms. While in ED suffered cardiac arrest.  1/16 - presented to [facility] ED, cardiac arrest, intubation, transferred to [facility], CRRT initiated   Patient is currently intubated on ventilator support. Currently requiring pressor support x 3, rates excalating. No family in room at the time of assessment. RN in room administering medications and pt undergoing CRRT.   Pt with muscle and fat depletions present to the upper body but edematous to the legs. Consistent with malnutrition  Pt not stable to feed at this time but does have increased needs in the context of her critical illness. Will leave recommendations for TF which can be utilized when pt is metabolic stable and fluid resuscitated.   MV: 12.9 L/min Temp (24hrs), Avg:95.7 F (35.4 C), Min:88.3 F (31.3 C), Max:98.8 F (37.1 C) MAP (Art Line)  Admit weight: 95.3 kg  Current weight: 101.2 kg  Edema present 1/17    Intake/Output Summary (Last 24 hours) at  11/27/2023 1328 Last data filed at 11/27/2023 1200 Gross per 24 hour  Intake 8876.72 ml  Output 3015 ml  Net 5861.72 ml  Net IO Since Admission: 5,961.72 mL [11/27/23 1328]  Drains/Lines: CVC Triple Lumen, right femoral Temporary HD catheter, left IJ, triple lumen OGT (gastric) Art Line UOP since admission  Nutritionally Relevant Medications: Scheduled Meds:  hydrocortisone sod succinate (SOLU-CORTEF) inj  100 mg Intravenous BID   pantoprazole (PROTONIX) IV  40 mg Intravenous Q12H   Continuous Infusions:  albumin human     epinephrine 17 mcg/min (11/27/23 0901)   norepinephrine (LEVOPHED) Adult infusion 46 mcg/min (11/27/23 0900)   sodium bicarbonate 150 mEq in sterile water 1,150 mL infusion 130 mL/hr at 11/27/23 0851   vasopressin 0.04 Units/min (11/27/23 0902)   Labs Reviewed: Na 130, chloride 93 BUN 64, creatinine 5.54 Phosphorus 11.1 CBG ranges from 34-209 mg/dL over the last 24 hours HgbA1c 7.8% (08/03/23, [facility])  NUTRITION - FOCUSED PHYSICAL EXAM: Flowsheet Row Most Recent Value  Orbital Region Mild depletion  Upper Arm Region Moderate depletion  Thoracic and Lumbar Region No depletion  Buccal Region Unable to assess  [ETT holder]  Temple Region Mild depletion  Clavicle Bone Region Mild depletion  Clavicle and Acromion Bone Region Moderate depletion  Scapular Bone Region Mild depletion  Dorsal Hand Mild depletion  Patellar Region Unable to assess  [edema]  Anterior Thigh Region Unable to assess  Posterior Calf Region Unable to assess  Edema (RD Assessment) Moderate  [BLE]  Hair Reviewed  Eyes Reviewed  Mouth Reviewed  Skin Reviewed  Nails Reviewed   Diet Order:  Diet Order             Diet NPO time specified  Diet effective now                   EDUCATION NEEDS:  Not appropriate for education at this time  Skin:  Skin Assessment: Reviewed RN Assessment  Last BM:  unsure  Height:  Ht Readings from Last 1 Encounters:   11/26/23 6' (1.829 m)    Weight:  Wt Readings from Last 1 Encounters:  11/27/23 101.2 kg    Ideal Body Weight:  72.7 kg  BMI:  Body mass index is 30.26 kg/m.  Estimated Nutritional Needs:  Kcal:  2000-2200 kcal/d Protein:  105-120g/d Fluid:  >/=2L/d    .Greig Castilla, RD, LDN Registered Dietitian II Please reach out via secure chat Weekend on-call pager # available in [facility]

## 2023-11-27 NOTE — Progress Notes (Signed)
Nephrology Follow-Up Consult note   Assessment/Recommendations: Vicki Murphy is a/an 73 y.o. female with a past medical history significant for DM2, achalasia, HTN, HLD, chronic pain, admitted for AMS, shock, PEA arrest, acute renal failure.       AKI: unclear baseline. ARF w/ severe electrolyte disturbance requiring CRRT on 1/16.  AKI likely ATN in the setting of severe shock and cardiac arrest.  Poor prognosis. -Continue CRRT as ordered; adjusting order today -advance goals of care conversations -Management of shock as below -Continue to monitor daily Cr, Dose meds for GFR -Monitor Daily I/Os, Daily weight  -Maintain MAP>65 for optimal renal perfusion.  -Avoid nephrotoxic medications including NSAIDs -Use synthetic opioids (Fentanyl/Dilaudid) if needed  Cardiac arrest: Continue workup per primary team  Shock: Severe requiring multiple pressors.  Continue management per primary team.  Lactic acidosis: Associated with shock.  CRRT as above.  Improved perfusion is stable  AMS: Likely multifactorial.  Management per primary team  DM2: Management per primary team  Elevated LFTs: Likely shock liver  Acute hypoxic respiratory failure: Management per primary team with ventilator   Recommendations conveyed to primary service.    [name] [name] 11/27/2023 9:26 AM  ___________________________________________________________  CC: cardiac arrest  Interval History/Subjective: Remains critically ill on 3 pressors.  Severe acidosis persistent.   Medications:  Current Facility-Administered Medications  Medication Dose Route Frequency Provider Last Rate Last Admin   0.9 %  sodium chloride infusion  250 mL Intravenous Continuous [name], MD   Stopped at 11/27/23 0757   albumin human 5 % solution 25 g  25 g Intravenous Once [name], MD       Chlorhexidine Gluconate Cloth 2 % PADS 6 each  6 each Topical Q2000 Briant Sites, DO        EPINEPHrine (ADRENALIN) 5 mg in NS 250 mL (0.02 mg/mL) premix infusion  0.5-20 mcg/min Intravenous Titrated [name], MD 51 mL/hr at 11/27/23 0901 17 mcg/min at 11/27/23 0901   fentaNYL (SUBLIMAZE) injection 12.5 mcg  12.5 mcg Intravenous Q1H PRN [name], [name], DO       heparin 10,000 units/ 20 mL infusion syringe  500-1,000 Units/hr CRRT Continuous [name], MD 1 mL/hr at 11/27/23 0900 500 Units/hr at 11/27/23 0900   heparin injection 1,000-6,000 Units  1,000-6,000 Units CRRT PRN [name], MD       heparin injection 5,000 Units  5,000 Units Subcutaneous Q8H [name], [name], DO       heparinized saline (2000 units/L) primer fluid for CRRT   CRRT PRN [name], MD       hydrocortisone sodium succinate (SOLU-CORTEF) 100 MG injection 100 mg  100 mg Intravenous BID [name], [name], MD       insulin aspart (novoLOG) injection 0-15 Units  0-15 Units Subcutaneous Q4H [name], [name], DO       norepinephrine (LEVOPHED) 16 mg in (0.064 mg/mL) premix infusion  0-60 mcg/min Intravenous Titrated [name], PA-C 43.1 mL/hr at 11/27/23 0900 46 mcg/min at 11/27/23 0900   Oral care mouth rinse  15 mL Mouth Rinse Q2H Briant Sites, DO   15 mL at 11/27/23 0820   Oral care mouth rinse  15 mL Mouth Rinse PRN Briant Sites, DO       pantoprazole (PROTONIX) injection 40 mg  40 mg Intravenous Q24H [name], [name], DO       prismasol BGK 2/2.5 dialysis solution   CRRT Continuous [name], MD 1,500  mL/hr at 11/27/23 0740 New Bag at 11/27/23 0740   sodium bicarbonate 150 mEq in sterile water 1,150 mL infusion   Intravenous Continuous [name], PA-C 125 mL/hr at 11/27/23 0900 Infusion Verify at 11/27/23 0900   sodium bicarbonate 150 mEq in sterile water 1,150 mL infusion   CRRT Continuous [name], MD 124 mL/hr at 11/27/23 0851 New Bag at 11/27/23 0851   sodium bicarbonate 150 mEq in sterile water 1,150 mL infusion   CRRT Continuous  [name], MD 130 mL/hr at 11/27/23 0851 New Bag at 11/27/23 0851   vasopressin (PITRESSIN) 20 Units in 100 mL (0.2 unit/mL) infusion-*FOR SHOCK*  0-0.04 Units/min Intravenous Continuous [name], PA-C 12 mL/hr at 11/27/23 0902 0.04 Units/min at 11/27/23 0902      Review of Systems: Unable to obtain due to the patient's sedation Physical Exam: Vitals:   11/27/23 0755 11/27/23 0800  BP: (!) 135/39 (!) 91/49  Pulse: 82 81  Resp: (!) 23 (!) 23  Temp:  98.6 F (37 C)  SpO2: 96% 98%   Total I/O In: 756.2 [I.V.:742.6; IV Piggyback:13.5] Out: 540   Intake/Output Summary (Last 24 hours) at 11/27/2023 9562 Last data filed at 11/27/2023 0900 Gross per 24 hour  Intake 8381.69 ml  Output 2778 ml  Net 5603.69 ml   Constitutional: ill-appearing, lying in bed, sedated ENMT: ears and nose without scars or lesions, [place]/AT CV: normal rate, trace edema in the ble Respiratory:bilateral chest rise, ventilated, coarse upper airway sounds Gastrointestinal: soft, non-tender, no palpable masses or hernias Skin: no visible lesions or rashes Psych: sedated not interactive   Test Results I personally reviewed new and old clinical labs and radiology tests Lab Results  Component Value Date   NA 130 (L) 11/27/2023   K 4.5 11/27/2023   CL 93 (L) 11/27/2023   CO2 <7 (L) 11/27/2023   BUN 64 (H) 11/27/2023   CREATININE 5.54 (H) 11/27/2023   CALCIUM 7.4 (L) 11/27/2023   ALBUMIN 2.4 (L) 11/27/2023   PHOS 11.1 (H) 11/27/2023    CBC Recent Labs  Lab 11/26/23 1221 11/26/23 2106 11/27/23 0420 11/27/23 0423 11/27/23 0702  WBC 10.1  --  19.8*  --   --   NEUTROABS 6.9  --   --   --   --   HGB 10.7*   < > 13.2 14.3 14.6  HCT 38.0   < > 44.1 42.0 43.0  MCV 92.9  --  87.7  --   --   PLT 235  --  284  --   --    < > = values in this interval not displayed.

## 2023-11-27 NOTE — Progress Notes (Signed)
Echocardiogram 2D Echocardiogram has been performed.  Vicki Murphy Vicki Murphy 11/27/2023, 9:51 AM

## 2023-11-27 NOTE — Progress Notes (Signed)
eLink Physician-Brief Progress Note Patient Name: Caidence Deas DOB: [birth year] MRN: [id number]   Date of Service  11/27/2023  HPI/Events of Note  Increasing pressors needed after CRRT started Mildly improving acidosis Hypothermia resolved  eICU Interventions  Hypocalcemia -repleted     Intervention Category Intermediate Interventions: Diagnostic test evaluation;Electrolyte abnormality - evaluation and management  Yui Mulvaney V. Merve Hotard 11/27/2023, 4:48 AM

## 2023-11-27 NOTE — Progress Notes (Addendum)
On initial assessment, pt with newly absent bowel sounds and new scant amount of blood tinged output with clots from OGT. Notified ELINK with these findings. D/w Dr. Marcelline Deist via phone call. Ground team present at time of discussion. This RN mentioned concern for being able to safely transport pt to CT at this time due to hypotension despite max dose vasopressors. KUB ordered.  Addendum 2300: Pt remains profoundly hypotensive. New onset agitation, restlessness in bed. Tachypneic, appears uncomfortable with gasping breaths on ventilator. Fentanyl drip ordered, unable to start at this time due to hypotension. Will try low dose push as ordered.

## 2023-11-27 NOTE — Progress Notes (Signed)
NAME:  Vicki Murphy, MRN:  [id number], DOB:  [birth year], LOS: 1 ADMISSION DATE:  11/26/2023, CONSULTATION DATE:  1/16 REFERRING MD:  Suezanne Jacquet, CHIEF COMPLAINT:  AMS   History of Present Illness:  Pt encephalopathic, therefore HPI obtained from EMR.   44 yoF with unclear PMH as appears medical care provided at [place], known HTN, HLD, and DM who presented to [facility] after being confused and hypoglycemic on her dexcom.  Reportedly at her baseline yesterday but possibly had recent cold/cough.  Presented unresponsive, hypotensive, bradycardic, and hypothermic at 88.2 progressed to brief cardiac arrest, ROSC after one round, s/p intubation and vasopressor support on epi and levophed.  Glucose initially 34.  Labs noted for lactic > 9, WBC 10.1, H/H 10.7/ 38, BUN/ sCr 80/ 6.98, K 6.2, undetectable bicarb, Na 131, Cl 96low albumin/ protein.  SARS/ flu/ RSV neg. UA mod leukocytes, WBC> 50, EKG noted afib with rate of 25, VBG ph < 7.2, CO2 29.  CXR noted for ETT 6cm above carina, diffuse interstitial changes, edema vs inflammatory process, gastric below diaphragm.  D50, bicarb gtt, 2L crystalloid given.  Cultured and empiric aztreonam, vanc and flagyl started.  Femoral CVL placed.  Transferred to [facility] for higher level of care, PCCM admitting.   Pertinent  Medical History  Limited as appears to receive care from [place] HTN, DM, HLD, chronic pain, resp? From home meds  Significant Hospital Events: Including procedures, antibiotic start and stop dates in addition to other pertinent events   1/16 admit/ tx [facility], cardiac arrest   Interim History / Subjective:  Sedated on vent, but able to follow commands   Objective   Blood pressure (!) 110/51, pulse 83, temperature 98.8 F (37.1 C), resp. rate 20, height 6' (1.829 m), weight 101.2 kg, SpO2 95%.    Vent Mode: PRVC FiO2 (%):  [50 %-60 %] 60 % Set Rate:  [20 bmp-24 bmp] 20 bmp Vt Set:  [580 mL] 580 mL PEEP:  [0 cmH20-5 cmH20] 5 cmH20 Plateau Pressure:  [17  cmH20-18 cmH20] 17 cmH20   Intake/Output Summary (Last 24 hours) at 11/27/2023 5638 Last data filed at 11/27/2023 0500 Gross per 24 hour  Intake 6881.39 ml  Output 1407 ml  Net 5474.39 ml   Filed Weights   11/26/23 1127 11/26/23 2300 11/27/23 0449  Weight: 95.3 kg 101.2 kg 101.2 kg    Examination: General: ill appearing elderly female laying in bed  HENT: ETT/OGT in place, L internal jugular central line, pupils dilated but reactive  Lungs: Clear breath sounds anteriorly, non labored on vent Cardiovascular: regular rate, rhythm Abdomen: soft, non distended Extremities: warm, no LE edema  Neuro: sedated, but awakes and follows commands  GU: foley  Lactic >9 WBC 19.8 Na 133 Bicarb <7 BUN/Cr 64/5.54, GFR 8 AST/ALT 1437/726 Phos 30 > 11.1 TSH 25, free T4 0.5  ABG: 6.906/32.7/86/6.5  Resolved Hospital Problem list     Assessment & Plan:   Cardiac Arrest - possibly secondary to septic UTI  - less likely cardiogenic shock because of wide pulse pressure - Vaso, epi, levophed; MAP goal >65 (reduce MAP goal if lactic improved) - Trend lactic  - continue aztreonam for UTI  - albumin fluid challenge  - Blood cultures with no growth <24 hrs - UA with moderate leukocytes, >50 WBC - echo pending  - start stress dose steroids 1/17  Acute hypoxic respiratory failure - Continue vent support: PRVC, RR 20, TV 580, PEEP 5, FiO2 60% - VAP protocol, stress ulcer prophylaxis -  Ween FiO2 as able for SpO2 >92% - ABG 1 hr after CRRT adjustments made - SBT when appropriate  - Duonebs PRN - PRN fentanyl for agitation/pain   Acute metabolic encephalopathy, improving - CTH/CT neck negative - TSH 25/free T4 0.5  Acute renal failure Severe metabolic acidosis/lactic acidosis Hyperphosphatemia - Bicarb gtt - CRRT; appreciate nephro assistance  - Continue foley  Shock Liver - AST/ALT 1437/726 - Trend   DM - NPO - CBG q4h + SSI  HTN - holding home meds in setting of  shock  Chronic pain - holding home lyrica, gabapentin   Best Practice (right click and "Reselect all SmartList Selections" daily)   Diet/type: NPO DVT prophylaxis prophylactic heparin  Pressure ulcer(s): pressure ulcer assessment deferred  GI prophylaxis: PPI Lines: Central line and yes and it is still needed Foley:  Yes, and it is still needed Code Status:  full code Last date of multidisciplinary goals of care discussion Sherren Kerns updated 1/16]  Labs   CBC: Recent Labs  Lab 11/26/23 1221 11/26/23 2106 11/26/23 2112 11/26/23 2117 11/27/23 0137 11/27/23 0420 11/27/23 0423  WBC 10.1  --   --   --   --  19.8*  --   NEUTROABS 6.9  --   --   --   --   --   --   HGB 10.7*   < > 12.9 12.9 12.6 13.2 14.3  HCT 38.0   < > 38.0 38.0 37.0 44.1 42.0  MCV 92.9  --   --   --   --  87.7  --   PLT 235  --   --   --   --  284  --    < > = values in this interval not displayed.    Basic Metabolic Panel: Recent Labs  Lab 11/26/23 1214 11/26/23 1221 11/26/23 1823 11/26/23 2104 11/26/23 2106 11/26/23 2112 11/26/23 2117 11/27/23 0137 11/27/23 0420 11/27/23 0423  NA 129* 131* 133* 132*   < > 127* 129* 129* 133* 127*  K 5.9* 6.2* 5.9* 6.3*   < > 5.9* 5.9* 5.5* 5.1 4.9  CL 102 96* 97* 96*  --   --   --   --  93*  --   CO2  --  <7* <7* <7*  --   --   --   --  <7*  --   GLUCOSE 158* 179* 140* 193*  --   --   --   --  148*  --   BUN 88* 80* 74* 73*  --   --   --   --  64*  --   CREATININE 7.60* 6.98* 6.74* 6.57*  --   --   --   --  5.54*  --   CALCIUM  --  8.6* 8.1* 8.2*  --   --   --   --  7.4*  --   MG  --   --  1.8 1.8  --   --   --   --  1.8  --   PHOS  --   --  >30.0* >30.0*  --   --   --   --  11.1*  --    < > = values in this interval not displayed.   GFR: Estimated Creatinine Clearance: 12.4 mL/min (A) (by C-G formula based on SCr of 5.54 mg/dL (H)). Recent Labs  Lab 11/26/23 1221 11/26/23 1350 11/26/23 1823 11/27/23 0420  WBC 10.1  --   --  19.8*   LATICACIDVEN >9.0* >9.0* >9.0*  --     Liver Function Tests: Recent Labs  Lab 11/26/23 1221 11/26/23 1823 11/27/23 0420  AST 24  --  1,437*  ALT 13  --  726*  ALKPHOS 55  --  119  BILITOT 0.6  --  1.9*  PROT 5.4*  --  5.2*  ALBUMIN 2.5* 2.0* 2.4*   No results for input(s): "LIPASE", "AMYLASE" in the last 168 hours. No results for input(s): "AMMONIA" in the last 168 hours.  ABG    Component Value Date/Time   PHART 6.840 (LL) 11/27/2023 0423   PCO2ART 37.3 11/27/2023 0423   PO2ART 89 11/27/2023 0423   HCO3 6.4 (L) 11/27/2023 0423   TCO2 7 (L) 11/27/2023 0423   ACIDBASEDEF 27.0 (H) 11/27/2023 0423   O2SAT 85 11/27/2023 0423     Coagulation Profile: No results for input(s): "INR", "PROTIME" in the last 168 hours.  Cardiac Enzymes: No results for input(s): "CKTOTAL", "CKMB", "CKMBINDEX", "TROPONINI" in the last 168 hours.  HbA1C: Hgb A1c MFr Bld  Date/Time Value Ref Range Status  06/07/2020 03:43 PM 6.9 (H) <5.7 % of total Hgb Final    Comment:    For someone without known diabetes, a hemoglobin A1c value of 6.5% or greater indicates that they may have  diabetes and this should be confirmed with a follow-up  test. . For someone with known diabetes, a value <7% indicates  that their diabetes is well controlled and a value  greater than or equal to 7% indicates suboptimal  control. A1c targets should be individualized based on  duration of diabetes, age, comorbid conditions, and  other considerations. . Currently, no consensus exists regarding use of hemoglobin A1c for diagnosis of diabetes for children. .     CBG: Recent Labs  Lab 11/26/23 1430 11/26/23 1703 11/26/23 1814 11/26/23 2328 11/27/23 0324  GLUCAP 121* 133* 130* 208* 170*    Review of Systems:   Unable to obtain   Past Medical History:  She,  has a past medical history of Arthritis, Diabetes mellitus, and Hypertension.   Surgical History:   Past Surgical History:  Procedure  Laterality Date   BREAST SURGERY     bio-psy negative   CARPAL TUNNEL RELEASE     right   JOINT REPLACEMENT  1992 and 2001   right knee     Social History:   reports that she has been smoking cigarettes. She has never used smokeless tobacco. She reports that she does not drink alcohol and does not use drugs.   Family History:  Her family history is not on file.   Allergies Allergies  Allergen Reactions   Morphine And Codeine    Penicillins    Tetanus-Diphtheria Toxoids Td      Home Medications  Prior to Admission medications   Medication Sig Start Date End Date Taking? Authorizing Provider  albuterol (VENTOLIN HFA) 108 (90 Base) MCG/ACT inhaler Inhale 2 puffs into the lungs 4 (four) times daily as needed for wheezing (and chest tightness). 10/29/23   [provider]  atenolol (TENORMIN) 50 MG tablet Take 50 mg by mouth daily.    [provider]  atorvastatin (LIPITOR) 40 MG tablet Take 20 mg by mouth at bedtime.    [provider]  chlorthalidone (HYGROTON) 25 MG tablet Take 25 mg by mouth daily. 07/14/23   [provider]  Cholecalciferol 25 MCG (1000 UT) capsule Take 1,000 Units by mouth daily.    [provider]  cyclobenzaprine (FLEXERIL) 10 MG tablet Take 10 mg by mouth 2 (two) times daily as needed for muscle spasms.    [provider]  diclofenac Sodium (VOLTAREN) 1 % GEL Apply 4 g topically 4 (four) times daily as needed (pain). 10/29/23   [provider]  EPINEPHrine 0.3 mg/0.3 mL IJ SOAJ injection Inject 0.3 mg into the muscle as needed for anaphylaxis. 10/29/23   [provider]  fluticasone-salmeterol (ADVAIR) 250-50 MCG/ACT AEPB Inhale 1 puff into the lungs in the morning and at bedtime. 10/29/23   [provider]  Glucagon, rDNA, (GLUCAGON EMERGENCY) 1 MG KIT Inject 1 mg into the muscle as needed (blood sugar less than 70).    [provider]  glucose 4 GM chewable tablet Chew 1  tablet by mouth as needed for low blood sugar.    [provider]  HYDROXYZINE HCL PO Take by mouth.    [provider]  Hypertonic Nasal Wash (SINUS RINSE KIT NA) Place 1 packet into the nose as needed (sinus rinses).    [provider]  lidocaine (XYLOCAINE) 5 % ointment Apply 1 Application topically 4 (four) times daily as needed (foot tingling and pain).    [provider]  Liraglutide (VICTOZA [place]) Inject 1.8 mg into the skin daily.    [provider]  melatonin 5 MG TABS Take 5 mg by mouth at bedtime as needed (sleep).    [provider]  metFORMIN (GLUCOPHAGE) 1000 MG tablet Take 1,000 mg by mouth in the morning and at bedtime.    [provider]  Multiple Vitamins-Minerals (MULTIVITAMIN ADULT, MINERALS,) TABS Take 1 tablet by mouth daily.    [provider]  naloxone [facility]) nasal spray 4 mg/0.1 mL Place 1 spray into the nose as needed (opioid overdose). 10/29/23   [provider]  nitroGLYCERIN (NITROSTAT) 0.4 MG SL tablet Place 0.4 mg under the tongue every 5 (five) minutes as needed for chest pain. 03/25/23   [provider]  omeprazole (PRILOSEC) 20 MG capsule Take 20 mg by mouth in the morning and at bedtime.    [provider]  pregabalin (LYRICA) 200 MG capsule Take 200 mg by mouth 2 (two) times daily. 07/14/23   [provider]  QUEtiapine (SEROQUEL) 200 MG tablet Take 100 mg by mouth at bedtime. 02/03/23   [provider]  sertraline (ZOLOFT) 100 MG tablet Take 100 mg by mouth daily.    [provider]     Critical care time: 35 min

## 2023-11-27 NOTE — Progress Notes (Addendum)
eLink Physician-Brief Progress Note Patient Name: Vicki Murphy DOB: 1951/01/24 MRN: [id number]   Date of Service  11/27/2023  HPI/Events of Note  No bowel sounds/Blood per NGT  eICU Interventions     Ground team evaluated pt Hypoactive Bowel sounds Slight tenderness Unstable for any imaging outside ICU Lactic acidosis  Will obtain KUB H&H Once more stable to obtain CT Attempted to discuss with family (no answer)  Addendum Agitation/vent dyssynchrony Fentanyl drip and boluses ordered KUB :unremarkable  Calcium is low  Calcium gluconate ordered  Addendum Hypocalcemia Mag:1.7  Will replace Ca Will give 1g of Mg      Rainbow Salman 11/27/2023, 8:28 PM

## 2023-11-27 NOTE — Procedures (Signed)
Patient Name: Vicki Murphy  MRN: [id number]  Epilepsy Attending: Charlsie Quest  Referring Physician/Provider: [name], PA-C  Date: 11/26/2023 Duration: 22.52 mins  Patient history: 73yo F s/p cardiac arrest getting eeg to evaluate for seizure  Level of alertness: comatose  AEDs during EEG study: None  Technical aspects: This EEG study was done with scalp electrodes positioned according to the 10-20 International system of electrode placement. Electrical activity was reviewed with band pass filter of 1-70Hz , sensitivity of 7 uV/mm, display speed of 58mm/sec with a 60Hz  notched filter applied as appropriate. EEG data were recorded continuously and digitally stored.  Video monitoring was available and reviewed as appropriate.  Description: EEG showed continuous generalized low amplitude 3 to 6 Hz theta-delta slowing. Hyperventilation and photic stimulation were not performed.     ABNORMALITY - Continuous slow, generalized  IMPRESSION: This study is suggestive of severe diffuse encephalopathy. No seizures or epileptiform discharges were seen throughout the recording.  Mckinley Olheiser [name]

## 2023-11-27 NOTE — Procedures (Signed)
I saw and evaluated the patient on CRRT.  I reviewed the last 24 hours events.  Adjustments to CRRT prescription are made as needed.  Increase DFR. Maintain even if able but limited by hypotension. Cont pressors for shock. Wean peripheral bicarb as able. On pre/post bicarb. Change as able  [facility] Weights   11/26/23 1127 11/26/23 2300 11/27/23 0449  Weight: 95.3 kg 101.2 kg 101.2 kg    Recent Labs  Lab 11/27/23 0420 11/27/23 0423 11/27/23 0702  NA 133*   < > 130*  K 5.1   < > 4.5  CL 93*  --   --   CO2 <7*  --   --   GLUCOSE 148*  --   --   BUN 64*  --   --   CREATININE 5.54*  --   --   CALCIUM 7.4*  --   --   PHOS 11.1*  --   --    < > = values in this interval not displayed.    Recent Labs  Lab 11/26/23 1221 11/26/23 2106 11/27/23 0420 11/27/23 0423 11/27/23 0702  WBC 10.1  --  19.8*  --   --   NEUTROABS 6.9  --   --   --   --   HGB 10.7*   < > 13.2 14.3 14.6  HCT 38.0   < > 44.1 42.0 43.0  MCV 92.9  --  87.7  --   --   PLT 235  --  284  --   --    < > = values in this interval not displayed.    Scheduled Meds:  Chlorhexidine Gluconate Cloth  6 each Topical Q2000   hydrocortisone sod succinate (SOLU-CORTEF) inj  100 mg Intravenous BID   mouth rinse  15 mL Mouth Rinse Q2H   pantoprazole (PROTONIX) IV  40 mg Intravenous Q12H   Continuous Infusions:  sodium chloride Stopped (11/27/23 0757)   albumin human     epinephrine 17 mcg/min (11/27/23 0901)   heparin 10,000 units/ 20 mL infusion syringe 500 Units/hr (11/27/23 0900)   norepinephrine (LEVOPHED) Adult infusion 46 mcg/min (11/27/23 0900)   prismasol BGK 2/2.5 dialysis solution 1,500 mL/hr at 11/27/23 0740   sodium bicarbonate 150 mEq in sterile water 1,150 mL infusion 125 mL/hr at 11/27/23 0900   sodium bicarbonate 150 mEq in sterile water 1,150 mL infusion 124 mL/hr at 11/27/23 0851   sodium bicarbonate 150 mEq in sterile water 1,150 mL infusion 130 mL/hr at 11/27/23 0851   vasopressin 0.04 Units/min  (11/27/23 0902)   PRN Meds:.fentaNYL (SUBLIMAZE) injection, heparin, heparin, mouth rinse   Louie Bun,  MD 11/27/2023, 9:24 AM

## 2023-11-28 ENCOUNTER — Inpatient Hospital Stay (HOSPITAL_COMMUNITY): Payer: No Typology Code available for payment source

## 2023-11-28 DIAGNOSIS — E440 Moderate protein-calorie malnutrition: Secondary | ICD-10-CM | POA: Insufficient documentation

## 2023-11-28 DIAGNOSIS — I469 Cardiac arrest, cause unspecified: Secondary | ICD-10-CM | POA: Diagnosis not present

## 2023-11-28 DIAGNOSIS — R569 Unspecified convulsions: Secondary | ICD-10-CM | POA: Diagnosis not present

## 2023-11-28 LAB — GLUCOSE, CAPILLARY
Glucose-Capillary: 100 mg/dL — ABNORMAL HIGH (ref 70–99)
Glucose-Capillary: 108 mg/dL — ABNORMAL HIGH (ref 70–99)
Glucose-Capillary: 76 mg/dL (ref 70–99)
Glucose-Capillary: 81 mg/dL (ref 70–99)
Glucose-Capillary: 88 mg/dL (ref 70–99)

## 2023-11-28 LAB — RENAL FUNCTION PANEL
Albumin: 2.3 g/dL — ABNORMAL LOW (ref 3.5–5.0)
Albumin: 1.6 g/dL — ABNORMAL LOW (ref 3.5–5.0)
Anion gap: 24 — ABNORMAL HIGH (ref 5–15)
Anion gap: 20 — ABNORMAL HIGH (ref 5–15)
BUN: 34 mg/dL — ABNORMAL HIGH (ref 8–23)
BUN: 25 mg/dL — ABNORMAL HIGH (ref 8–23)
CO2: 16 mmol/L — ABNORMAL LOW (ref 22–32)
CO2: 18 mmol/L — ABNORMAL LOW (ref 22–32)
Calcium: 6.2 mg/dL — CL (ref 8.9–10.3)
Calcium: 5.8 mg/dL — CL (ref 8.9–10.3)
Chloride: 87 mmol/L — ABNORMAL LOW (ref 98–111)
Chloride: 95 mmol/L — ABNORMAL LOW (ref 98–111)
Creatinine, Ser: 2.43 mg/dL — ABNORMAL HIGH (ref 0.44–1.00)
Creatinine, Ser: 1.71 mg/dL — ABNORMAL HIGH (ref 0.44–1.00)
GFR, Estimated: 21 mL/min — ABNORMAL LOW (ref >60)
GFR, Estimated: 32 mL/min — ABNORMAL LOW (ref >60)
Glucose, Bld: 104 mg/dL — ABNORMAL HIGH (ref 70–99)
Glucose, Bld: 76 mg/dL (ref 70–99)
Phosphorus: 3.8 mg/dL (ref 2.5–4.6)
Phosphorus: 5.8 mg/dL — ABNORMAL HIGH (ref 2.5–4.6)
Potassium: 3.7 mmol/L (ref 3.5–5.1)
Potassium: 5 mmol/L (ref 3.5–5.1)
Sodium: 127 mmol/L — ABNORMAL LOW (ref 135–145)
Sodium: 133 mmol/L — ABNORMAL LOW (ref 135–145)

## 2023-11-28 LAB — URINE CULTURE: Culture: >=100000 — AB

## 2023-11-28 LAB — POCT I-STAT 7, (LYTES, BLD GAS, ICA,H+H)
Acid-base deficit: 5 mmol/L — ABNORMAL HIGH (ref 0.0–2.0)
Acid-base deficit: 11 mmol/L — ABNORMAL HIGH (ref 0.0–2.0)
Acid-base deficit: 8 mmol/L — ABNORMAL HIGH (ref 0.0–2.0)
Bicarbonate: 20.4 mmol/L (ref 20.0–28.0)
Bicarbonate: 16.8 mmol/L — ABNORMAL LOW (ref 20.0–28.0)
Bicarbonate: 18.5 mmol/L — ABNORMAL LOW (ref 20.0–28.0)
Calcium, Ion: 0.89 mmol/L — CL (ref 1.15–1.40)
Calcium, Ion: 0.87 mmol/L — CL (ref 1.15–1.40)
Calcium, Ion: 0.84 mmol/L — CL (ref 1.15–1.40)
HCT: 34 % — ABNORMAL LOW (ref 36.0–46.0)
HCT: 31 % — ABNORMAL LOW (ref 36.0–46.0)
HCT: 27 % — ABNORMAL LOW (ref 36.0–46.0)
Hemoglobin: 11.6 g/dL — ABNORMAL LOW (ref 12.0–15.0)
Hemoglobin: 10.5 g/dL — ABNORMAL LOW (ref 12.0–15.0)
Hemoglobin: 9.2 g/dL — ABNORMAL LOW (ref 12.0–15.0)
O2 Saturation: 85 %
O2 Saturation: 89 %
O2 Saturation: 90 %
Patient temperature: 98.4
Patient temperature: 98.4
Patient temperature: 98.5
Potassium: 5.1 mmol/L (ref 3.5–5.1)
Potassium: 6.1 mmol/L — ABNORMAL HIGH (ref 3.5–5.1)
Potassium: 5.4 mmol/L — ABNORMAL HIGH (ref 3.5–5.1)
Sodium: 127 mmol/L — ABNORMAL LOW (ref 135–145)
Sodium: 127 mmol/L — ABNORMAL LOW (ref 135–145)
Sodium: 129 mmol/L — ABNORMAL LOW (ref 135–145)
TCO2: 21 mmol/L — ABNORMAL LOW (ref 22–32)
TCO2: 18 mmol/L — ABNORMAL LOW (ref 22–32)
TCO2: 20 mmol/L — ABNORMAL LOW (ref 22–32)
pCO2 arterial: 36.3 mm[Hg] (ref 32–48)
pCO2 arterial: 44.7 mm[Hg] (ref 32–48)
pCO2 arterial: 41.1 mm[Hg] (ref 32–48)
pH, Arterial: 7.357 (ref 7.35–7.45)
pH, Arterial: 7.181 — CL (ref 7.35–7.45)
pH, Arterial: 7.261 — ABNORMAL LOW (ref 7.35–7.45)
pO2, Arterial: 51 mm[Hg] — ABNORMAL LOW (ref 83–108)
pO2, Arterial: 71 mm[Hg] — ABNORMAL LOW (ref 83–108)
pO2, Arterial: 67 mm[Hg] — ABNORMAL LOW (ref 83–108)

## 2023-11-28 LAB — CBC
HCT: 31.8 % — ABNORMAL LOW (ref 36.0–46.0)
HCT: 27.5 % — ABNORMAL LOW (ref 36.0–46.0)
Hemoglobin: 11.1 g/dL — ABNORMAL LOW (ref 12.0–15.0)
Hemoglobin: 9.7 g/dL — ABNORMAL LOW (ref 12.0–15.0)
MCH: 26.2 pg (ref 26.0–34.0)
MCH: 26.4 pg (ref 26.0–34.0)
MCHC: 34.9 g/dL (ref 30.0–36.0)
MCHC: 35.3 g/dL (ref 30.0–36.0)
MCV: 75.2 fL — ABNORMAL LOW (ref 80.0–100.0)
MCV: 74.9 fL — ABNORMAL LOW (ref 80.0–100.0)
Platelets: 142 10*3/uL — ABNORMAL LOW (ref 150–400)
Platelets: 106 10*3/uL — ABNORMAL LOW (ref 150–400)
RBC: 4.23 MIL/uL (ref 3.87–5.11)
RBC: 3.67 MIL/uL — ABNORMAL LOW (ref 3.87–5.11)
RDW: 14.6 % (ref 11.5–15.5)
RDW: 14.6 % (ref 11.5–15.5)
WBC: 6.8 10*3/uL (ref 4.0–10.5)
WBC: 3.2 10*3/uL — ABNORMAL LOW (ref 4.0–10.5)
nRBC: 4.3 % — ABNORMAL HIGH (ref 0.0–0.2)
nRBC: 11.7 % — ABNORMAL HIGH (ref 0.0–0.2)

## 2023-11-28 LAB — HEPATIC FUNCTION PANEL
ALT: 439 U/L — ABNORMAL HIGH (ref 0–44)
AST: 488 U/L — ABNORMAL HIGH (ref 15–41)
Albumin: 2.3 g/dL — ABNORMAL LOW (ref 3.5–5.0)
Alkaline Phosphatase: 102 U/L (ref 38–126)
Bilirubin, Direct: 0.9 mg/dL — ABNORMAL HIGH (ref 0.0–0.2)
Indirect Bilirubin: 1.3 mg/dL — ABNORMAL HIGH (ref 0.3–0.9)
Total Bilirubin: 2.2 mg/dL — ABNORMAL HIGH (ref 0.0–1.2)
Total Protein: 4.5 g/dL — ABNORMAL LOW (ref 6.5–8.1)

## 2023-11-28 LAB — MAGNESIUM: Magnesium: 1.7 mg/dL (ref 1.7–2.4)

## 2023-11-28 LAB — APTT
aPTT: 141 s — ABNORMAL HIGH (ref 24–36)
aPTT: 141 s — ABNORMAL HIGH (ref 24–36)

## 2023-11-28 MED ORDER — MAGNESIUM SULFATE IN D5W 1-5 GM/100ML-% IV SOLN
1.0000 g | Freq: Once | INTRAVENOUS | Status: AC
  Administered 2023-11-28: 1 g via INTRAVENOUS
  Filled 2023-11-28: qty 100

## 2023-11-28 MED ORDER — AMIODARONE HCL IN DEXTROSE 360-4.14 MG/200ML-% IV SOLN: 30.0000 mg/h | INTRAVENOUS | Status: DC

## 2023-11-28 MED ORDER — PRISMASOL BGK 0/2.5 32-2.5 MEQ/L EC SOLN
Status: DC
  Filled 2023-11-28 (×3): qty 5000

## 2023-11-28 MED ORDER — CALCIUM GLUCONATE-NACL 2-0.675 GM/100ML-% IV SOLN
2.0000 g | Freq: Once | INTRAVENOUS | Status: AC
  Administered 2023-11-28: 2000 mg via INTRAVENOUS
  Filled 2023-11-28: qty 100

## 2023-11-28 MED ORDER — PRISMASOL BGK 0/2.5 32-2.5 MEQ/L EC SOLN
Status: DC
  Filled 2023-11-28 (×5): qty 5000

## 2023-11-28 MED ORDER — SODIUM BICARBONATE 8.4 % IV SOLN
100.0000 meq | Freq: Once | INTRAVENOUS | Status: AC
  Administered 2023-11-28: 100 meq via INTRAVENOUS

## 2023-11-28 MED ORDER — SODIUM BICARBONATE 8.4 % IV SOLN
INTRAVENOUS | Status: AC
  Filled 2023-11-28: qty 100

## 2023-11-28 MED ORDER — SODIUM BICARBONATE 8.4 % IV SOLN
100.0000 meq | Freq: Once | INTRAVENOUS | Status: AC
  Administered 2023-11-28: 100 meq via INTRAVENOUS
  Filled 2023-11-28: qty 100

## 2023-11-28 MED ORDER — SODIUM CHLORIDE 0.9 % IV SOLN
350.0000 [IU]/h | INTRAVENOUS | Status: DC
  Administered 2023-11-28 (×2): 350 [IU]/h via INTRAVENOUS_CENTRAL
  Filled 2023-11-28: qty 10000
  Filled 2023-11-28: qty 2

## 2023-11-28 MED ORDER — SODIUM CHLORIDE 0.9 % IV BOLUS
500.0000 mL | Freq: Once | INTRAVENOUS | Status: AC
  Administered 2023-11-28: 500 mL via INTRAVENOUS

## 2023-11-28 MED ORDER — PRISMASOL BGK 0/2.5 32-2.5 MEQ/L EC SOLN
Status: DC
  Filled 2023-11-28 (×11): qty 5000

## 2023-11-28 MED ORDER — AMIODARONE HCL IN DEXTROSE 360-4.14 MG/200ML-% IV SOLN
60.0000 mg/h | INTRAVENOUS | Status: DC
  Administered 2023-11-28: 60 mg/h via INTRAVENOUS

## 2023-11-28 MED ORDER — SODIUM BICARBONATE 8.4 % IV SOLN
INTRAVENOUS | Status: DC
Start: 2023-11-28 — End: 2023-11-29
  Filled 2023-11-28: qty 1000
  Filled 2023-11-28: qty 150

## 2023-11-28 MED ORDER — DEXTROSE 5 % IV SOLN
1.0000 g | Freq: Three times a day (TID) | INTRAVENOUS | Status: DC
  Administered 2023-11-28: 1 g via INTRAVENOUS
  Filled 2023-11-28 (×5): qty 5

## 2023-11-28 MED ORDER — PRISMASOL BGK 4/2.5 32-4-2.5 MEQ/L EC SOLN
Status: DC
  Filled 2023-11-28 (×10): qty 5000
  Filled 2023-11-28: qty 20000
  Filled 2023-11-28: qty 5000

## 2023-11-30 LAB — CULTURE, RESPIRATORY W GRAM STAIN

## 2023-12-01 LAB — CULTURE, BLOOD (ROUTINE X 2)
Culture: NO GROWTH
Culture: NO GROWTH
Special Requests: ADEQUATE
Special Requests: ADEQUATE

## 2023-12-12 NOTE — Progress Notes (Signed)
Family about to leave bedside. Pt's sister, Christinia Gully, and nephew, Ethelene Browns, and Ouida Sills, updated about patients critical condition and status towards imminent death. Aleathea said she understood and that she still needed to leave the unit. Aleathea updated that we would call her with any changes.

## 2023-12-12 NOTE — Progress Notes (Addendum)
Pt's time of death: 1915    Auscultated heart and lung sound x1 min along w/Stephanie Savage RN ... No lungs or heart sounds auscultated.    No visible chest rise seen   Notified eLink  Called and notified Aleathea (sister). She told me she would notify Ethelene Browns [facility]). Ethelene Browns called and he is on his way to see pt.  Printed asystole strip and placed in chart

## 2023-12-12 NOTE — Progress Notes (Signed)
NAME:  Vicki Murphy, MRN:  [id number], DOB:  June 02, 1951, LOS: 2 ADMISSION DATE:  11/26/2023, CONSULTATION DATE:  1/16 REFERRING MD:  Suezanne Jacquet, CHIEF COMPLAINT:  AMS   History of Present Illness:  Pt encephalopathic, therefore HPI obtained from EMR.   81 yoF with unclear PMH as appears medical care provided at [place], known HTN, HLD, and DM who presented to [facility] after being confused and hypoglycemic on her dexcom.  Reportedly at her baseline yesterday but possibly had recent cold/cough.  Presented unresponsive, hypotensive, bradycardic, and hypothermic at 88.2 progressed to brief cardiac arrest, ROSC after one round, s/p intubation and vasopressor support on epi and levophed.  Glucose initially 34.  Labs noted for lactic > 9, WBC 10.1, H/H 10.7/ 38, BUN/ sCr 80/ 6.98, K 6.2, undetectable bicarb, Na 131, Cl 96low albumin/ protein.  SARS/ flu/ RSV neg. UA mod leukocytes, WBC> 50, EKG noted afib with rate of 25, VBG ph < 7.2, CO2 29.  CXR noted for ETT 6cm above carina, diffuse interstitial changes, edema vs inflammatory process, gastric below diaphragm.  D50, bicarb gtt, 2L crystalloid given.  Cultured and empiric aztreonam, vanc and flagyl started.  Femoral CVL placed.  Transferred to [facility] for higher level of care, PCCM admitting.   Pertinent  Medical History  Limited as appears to receive care from [place] HTN, DM, HLD, chronic pain, resp? From home meds  Significant Hospital Events: Including procedures, antibiotic start and stop dates in addition to other pertinent events   1/16 admit/ tx [facility], cardiac arrest; on levo/epi/vaso and CRRT   Interim History / Subjective:  More sedated this morning, not able to follow any commands   Objective   Blood pressure (!) 96/54, pulse 92, temperature 97.6 F (36.4 C), temperature source Oral, resp. rate (!) 26, height 6' (1.829 m), weight 104.4 kg, SpO2 93%.    Vent Mode: PRVC FiO2 (%):  [40 %-50 %] 40 % Set Rate:  [20 bmp] 20 bmp Vt Set:  [580 mL] 580  mL PEEP:  [5 cmH20] 5 cmH20 Plateau Pressure:  [12 cmH20-24 cmH20] 20 cmH20   Intake/Output Summary (Last 24 hours) at  0617 Last data filed at  0600 Gross per 24 hour  Intake 6702.67 ml  Output 1979 ml  Net 4723.67 ml   Filed Weights   11/26/23 2300 11/27/23 0449  0500  Weight: 101.2 kg 101.2 kg 104.4 kg    Examination: General: ill appearing elderly female laying in bed  HENT: ETT/OGT in place, L internal jugular central line, pupils reactive Lungs: Clear breath sounds anteriorly, non labored on vent Cardiovascular: regular rate, rhythm Abdomen: soft, non distended Extremities: cool to touch, no LE edema  Neuro: sedated, not following commands  GU: foley  Lactic >9 WBC 19.8 > 6.8 Na 127 Bicarb 16 Ca 6.2 BUN/Cr 34/2.43, GFR 21 AST/ALT 1437/726 > 488/439 Phos 3.8  ABG: 7.264/31.8/70/14.4  Epi gtt 7 mcg Norepi 49 Vaso 0.04  Resolved Hospital Problem list     Assessment & Plan:   Cardiac Arrest - possibly secondary to septic UTI  - cardiogenic shock ruled out (wide pulse pressures) - Vaso, epi, levophed; SBP goal >110  - wean epi first  - lactic remains >9 likely secondary to epi gtt  - continue aztreonam for UTI  - s/p albumin fluid challenge  - Blood cultures with no growth at 2 days - UA with moderate leukocytes, >50 WBC - urine culture with >100,000 colonies klebsiella pneumoniae - echo w/ EF of 55-60%, valves  not well visualized  - started stress dose steroids 1/17  Acute hypoxic respiratory failure - Continue vent support: PRVC, RR 20, TV 580, PEEP 5, FiO2 40% - Repeat ABG - VAP protocol, stress ulcer prophylaxis - SBT when appropriate  - Duonebs PRN - Wean fentanyl gtt as able   Acute metabolic encephalopathy, improving - CTH/CT neck negative - TSH 25/free T4 0.5  Acute renal failure Severe metabolic acidosis/lactic acidosis Hyperphosphatemia - Discontinue bicarb gtt - CRRT; appreciate nephro assistance  -  Continue foley  Shock Liver - AST/ALT downtrending to 488/439 - Trend   DM - Start trickle feeds - CBG q4h + SSI  HTN - holding home meds in setting of shock  Chronic pain - holding home lyrica, gabapentin   Best Practice (right click and "Reselect all SmartList Selections" daily)   Diet/type: NPO DVT prophylaxis prophylactic heparin  Pressure ulcer(s): pressure ulcer assessment deferred  GI prophylaxis: PPI Lines: Central line and yes and it is still needed Foley:  Yes, and it is still needed Code Status:  full code Last date of multidisciplinary goals of care discussion [1/17]  Labs   CBC: Recent Labs  Lab 11/26/23 1221 11/26/23 2106 11/27/23 0420 11/27/23 0423 11/27/23 0702 11/27/23 2255  0445  WBC 10.1  --  19.8*  --   --   --  6.8  NEUTROABS 6.9  --   --   --   --   --   --   HGB 10.7*   < > 13.2 14.3 14.6 11.2* 11.1*  HCT 38.0   < > 44.1 42.0 43.0 33.0* 31.8*  MCV 92.9  --  87.7  --   --   --  75.2*  PLT 235  --  284  --   --   --  142*   < > = values in this interval not displayed.    Basic Metabolic Panel: Recent Labs  Lab 11/26/23 1823 11/26/23 2104 11/26/23 2106 11/27/23 0420 11/27/23 0423 11/27/23 0702 11/27/23 1610 11/27/23 2255  0445  0446  NA 133* 132*   < > 133* 127* 130* 131* 127*  --  127*  K 5.9* 6.3*   < > 5.1 4.9 4.5 4.2 3.7  --  3.7  CL 97* 96*  --  93*  --   --  90*  --   --  87*  CO2 <7* <7*  --  <7*  --   --  10*  --   --  16*  GLUCOSE 140* 193*  --  148*  --   --  153*  --   --  104*  BUN 74* 73*  --  64*  --   --  45*  --   --  34*  CREATININE 6.74* 6.57*  --  5.54*  --   --  3.80*  --   --  2.43*  CALCIUM 8.1* 8.2*  --  7.4*  --   --  6.8*  --   --  6.2*  MG 1.8 1.8  --  1.8  --   --   --   --  1.7  --   PHOS >30.0* >30.0*  --  11.1*  --   --  5.9*  --   --  3.8   < > = values in this interval not displayed.   GFR: Estimated Creatinine Clearance: 28.7 mL/min (A) (by C-G formula based on SCr of  2.43 mg/dL (H)). Recent Labs  Lab 11/26/23  1221 11/26/23 1350 11/26/23 1823 11/27/23 0420 11/27/23 0928 11/27/23 1230 11/27/23 1610  0445  WBC 10.1  --   --  19.8*  --   --   --  6.8  LATICACIDVEN >9.0*   < > >9.0*  --  >9.0* >9.0* >9.0*  --    < > = values in this interval not displayed.    Liver Function Tests: Recent Labs  Lab 11/26/23 1221 11/26/23 1823 11/27/23 0420 11/27/23 1610  0445  0446  AST 24  --  1,437*  --  488*  --   ALT 13  --  726*  --  439*  --   ALKPHOS 55  --  119  --  102  --   BILITOT 0.6  --  1.9*  --  2.2*  --   PROT 5.4*  --  5.2*  --  4.5*  --   ALBUMIN 2.5* 2.0* 2.4* 2.5* 2.3* 2.3*   No results for input(s): "LIPASE", "AMYLASE" in the last 168 hours. No results for input(s): "AMMONIA" in the last 168 hours.  ABG    Component Value Date/Time   PHART 7.264 (L) 11/27/2023 2255   PCO2ART 31.8 (L) 11/27/2023 2255   PO2ART 70 (L) 11/27/2023 2255   HCO3 14.4 (L) 11/27/2023 2255   TCO2 15 (L) 11/27/2023 2255   ACIDBASEDEF 12.0 (H) 11/27/2023 2255   O2SAT 91 11/27/2023 2255     Coagulation Profile: No results for input(s): "INR", "PROTIME" in the last 168 hours.  Cardiac Enzymes: No results for input(s): "CKTOTAL", "CKMB", "CKMBINDEX", "TROPONINI" in the last 168 hours.  HbA1C: Hgb A1c MFr Bld  Date/Time Value Ref Range Status  06/07/2020 03:43 PM 6.9 (H) <5.7 % of total Hgb Final    Comment:    For someone without known diabetes, a hemoglobin A1c value of 6.5% or greater indicates that they may have  diabetes and this should be confirmed with a follow-up  test. . For someone with known diabetes, a value <7% indicates  that their diabetes is well controlled and a value  greater than or equal to 7% indicates suboptimal  control. A1c targets should be individualized based on  duration of diabetes, age, comorbid conditions, and  other considerations. . Currently, no consensus exists regarding use  of hemoglobin A1c for diagnosis of diabetes for children. .     CBG: Recent Labs  Lab 11/27/23 1156 11/27/23 1558 11/27/23 1916  0006  0445  GLUCAP 138* 156* 153* 108* 100*    Review of Systems:   Unable to obtain   Past Medical History:  She,  has a past medical history of Arthritis, Diabetes mellitus, and Hypertension.   Surgical History:   Past Surgical History:  Procedure Laterality Date   BREAST SURGERY     bio-psy negative   CARPAL TUNNEL RELEASE     right   JOINT REPLACEMENT  1992 and 2001   right knee     Social History:   reports that she has been smoking cigarettes. She has never used smokeless tobacco. She reports that she does not drink alcohol and does not use drugs.   Family History:  Her family history is not on file.   Allergies Allergies  Allergen Reactions   Penicillins Anaphylaxis   Bee Venom Swelling   Morphine And Codeine Swelling   Sulfa Antibiotics Swelling   Tetanus-Diphtheria Toxoids Td Swelling     Home Medications  Prior to Admission medications   Medication Sig Start Date  End Date Taking? Authorizing Provider  albuterol (VENTOLIN HFA) 108 (90 Base) MCG/ACT inhaler Inhale 2 puffs into the lungs 4 (four) times daily as needed for wheezing (and chest tightness). 10/29/23   [provider]  atenolol (TENORMIN) 50 MG tablet Take 50 mg by mouth daily.    [provider]  atorvastatin (LIPITOR) 40 MG tablet Take 20 mg by mouth at bedtime.    [provider]  chlorthalidone (HYGROTON) 25 MG tablet Take 25 mg by mouth daily. 07/14/23   [provider]  Cholecalciferol 25 MCG (1000 UT) capsule Take 1,000 Units by mouth daily.    [provider]  cyclobenzaprine (FLEXERIL) 10 MG tablet Take 10 mg by mouth 2 (two) times daily as needed for muscle spasms.    [provider]  diclofenac Sodium (VOLTAREN) 1 % GEL Apply 4 g topically 4 (four) times daily as needed (pain).  10/29/23   [provider]  EPINEPHrine 0.3 mg/0.3 mL IJ SOAJ injection Inject 0.3 mg into the muscle as needed for anaphylaxis. 10/29/23   [provider]  fluticasone-salmeterol (ADVAIR) 250-50 MCG/ACT AEPB Inhale 1 puff into the lungs in the morning and at bedtime. 10/29/23   [provider]  Glucagon, rDNA, (GLUCAGON EMERGENCY) 1 MG KIT Inject 1 mg into the muscle as needed (blood sugar less than 70).    [provider]  glucose 4 GM chewable tablet Chew 1 tablet by mouth as needed for low blood sugar.    [provider]  HYDROXYZINE HCL PO Take by mouth.    [provider]  Hypertonic Nasal Wash (SINUS RINSE KIT NA) Place 1 packet into the nose as needed (sinus rinses).    [provider]  lidocaine (XYLOCAINE) 5 % ointment Apply 1 Application topically 4 (four) times daily as needed (foot tingling and pain).    [provider]  Liraglutide (VICTOZA [place]) Inject 1.8 mg into the skin daily.    [provider]  melatonin 5 MG TABS Take 5 mg by mouth at bedtime as needed (sleep).    [provider]  metFORMIN (GLUCOPHAGE) 1000 MG tablet Take 1,000 mg by mouth in the morning and at bedtime.    [provider]  Multiple Vitamins-Minerals (MULTIVITAMIN ADULT, MINERALS,) TABS Take 1 tablet by mouth daily.    [provider]  naloxone [facility]) nasal spray 4 mg/0.1 mL Place 1 spray into the nose as needed (opioid overdose). 10/29/23   [provider]  nitroGLYCERIN (NITROSTAT) 0.4 MG SL tablet Place 0.4 mg under the tongue every 5 (five) minutes as needed for chest pain. 03/25/23   [provider]  omeprazole (PRILOSEC) 20 MG capsule Take 20 mg by mouth in the morning and at bedtime.    [provider]  pregabalin (LYRICA) 200 MG capsule Take 200 mg by mouth 2 (two) times daily. 07/14/23   [provider]  QUEtiapine (SEROQUEL) 200 MG tablet Take 100 mg by mouth at  bedtime. 02/03/23   [provider]  sertraline (ZOLOFT) 100 MG tablet Take 100 mg by mouth daily.    [provider]     Critical care time: 35 min

## 2023-12-12 NOTE — Progress Notes (Signed)
    1725  Spiritual Encounters  Care provided to: Family;Pt not available  Conversation partners present during encounter Nurse  Referral source Nurse (RN/NT/LPN)  Reason for visit Routine spiritual support  OnCall Visit Yes   Received call from Nurse requesting visit. Patient's sister, Tedra Coupe and Nephew were present at the patient's bedside. Played Patient's play list of her favorite gospel song, prayed with family, offered companionate touch and reflective listening as spiritual care. Sister welcomes return visit. Nurse will keep chaplain informed of any new development.

## 2023-12-12 NOTE — Progress Notes (Signed)
Maint done, no skin breakdown

## 2023-12-12 NOTE — Discharge Summary (Signed)
DEATH SUMMARY   Patient Details  Name: Vicki Murphy MRN: [id number] DOB: 17-May-1951  Admission/Discharge Information   Admit Date:  12-04-2023  Date of Death: Date of Death: December 06, 2023  Time of Death: Time of Death: 12/13/13  Length of Stay: 2  Referring Physician: [facility]   Reason(s) for Hospitalization  Confusion and hypotension.  Diagnoses  Preliminary cause of death: Urinary tract infection Secondary Diagnoses (including complications and co-morbidities):  Principal Problem:   Cardiac arrest [facility]) Active Problems:   Malnutrition of moderate degree Septic shock Acute hypoxic respiratory failure Encephalopathy of sepsis AKI  Brief Hospital Course (including significant findings, care, treatment, and services provided and events leading to death)  This was a 73 year old woman who presented with confusion and hypoglycemia.  She was found to be bradycardic hypotensive and hypothermic.  She suffered a brief period of cardiac arrest which responded to 1 round of CPR.  She was intubated and started on vasopressor support in the ED.  She was found to have a elevated lactate and was profoundly acidotic.  White count was greater than 50.  She was felt to be in septic shock due to suspected urinary tract infection.  She eventually grew greater than 100,000 colonies of Klebsiella in her urine and she had elevated pyuria.  Despite prompt initiation of antimicrobial therapy her vasopressor continued to be high.  Imaging showed no evidence of a drainable source.  She was started on CRRT for AKI.  An EEG showed no evidence of seizures.  By the second day her pressor requirements had improved somewhat but then she began to deteriorate again on the third day and continued to worsening hypotension and spite of maximal dose of vasopressors.  Family made her DNR and CPR was not provided when she finally went into cardiac arrest.  Time of death was 26.  Pertinent Labs and Studies   Significant Diagnostic Studies US RENAL Result Date: 12-06-23 CLINICAL DATA:  73 year old female with renal failure. EXAM: RENAL / URINARY TRACT ULTRASOUND COMPLETE COMPARISON:  Abdominal radiographs 0831 hours today. FINDINGS: Right Kidney: Renal measurements: 13.2 x 4.7 x 4.9 cm = volume: 158 mL. Right renal echogenicity is homogeneous but increased compared to the liver on images 8 and 12. No mass or hydronephrosis visualized. Left Kidney: Renal measurements: 11.1 x 5.6 x 5.2 cm = volume: 167 mL. Left renal echogenicity similar to that on the right side. Diminutive, no mass or hydronephrosis visualized. Bladder: Appears normal for degree of bladder distention. Other: None. IMPRESSION: Increased renal echogenicity compatible chronic medical renal disease. No hydronephrosis or renal mass. Electronically Signed   By: Odessa Fleming M.D.   On: 2023/12/06 14:21   DG Abd Portable 1V Result Date: December 06, 2023 CLINICAL DATA:  73 year old female status post orogastric tube placement. EXAM: PORTABLE ABDOMEN - 1 VIEW COMPARISON:  11/27/2023. FINDINGS: Tip of enteric tube is in the midbody of the stomach with side port just distal to the gastroesophageal junction. Central venous catheter via right femoral approach with tip likely in the proximal right common iliac vein. Gas and stool are seen scattered throughout the colon extending to the level of the distal rectum. No pathologic distension of small bowel is noted. No gross evidence of pneumoperitoneum. IMPRESSION: 1. Support apparatus, as above. 2. Nonobstructive bowel gas pattern. Electronically Signed   By: Trudie Reed M.D.   On: 12-06-2023 08:47   DG Abd 1 View Result Date: 11/27/2023 CLINICAL DATA:  Distension cardiac arrest EXAM: ABDOMEN - 1 VIEW COMPARISON:  None Available. FINDINGS: Overall nonobstructed gas pattern.  No radiopaque calculi. IMPRESSION: Nonobstructed gas pattern. Electronically Signed   By: Jasmine Pang M.D.   On: 11/27/2023 21:10    ECHOCARDIOGRAM COMPLETE Result Date: 11/27/2023    ECHOCARDIOGRAM REPORT   Patient Name:   Vicki Murphy Date of Exam: 11/27/2023 Medical Rec #:  [id number]          Height:       72.0 in Accession #:    [id number]         Weight:       223.1 lb Date of Birth:  May 18, 1951           BSA:          2.232 m Patient Age:    72 years           BP:           110/51 mmHg Patient Gender: F                  HR:           82 bpm. Exam Location:  Inpatient Procedure: 2D Echo, Cardiac Doppler, Color Doppler and Intracardiac            Opacification Agent Indications:    Cardiac Arrest  History:        Patient has no prior history of Echocardiogram examinations.                 Risk Factors:Hypertension, Diabetes and Current Smoker.  Sonographer:    Karma Ganja Referring Phys: [phone] PAULA B SIMPSON  Sonographer Comments: Technically difficult study due to poor echo windows, suboptimal parasternal window, suboptimal apical window and echo performed with patient supine and on artificial respirator. IMPRESSIONS  1. Poor quality study do to poor acoustic windows.  2. Left ventricular ejection fraction, by estimation, is 55 to 60%. The left ventricle has normal function. Left ventricular endocardial border not optimally defined to evaluate regional wall motion. Left ventricular diastolic function could not be evaluated.  3. Right ventricular systolic function is moderately reduced. The right ventricular size is mildly enlarged.  4. The mitral valve was not well visualized. No evidence of mitral valve regurgitation.  5. Tricuspid valve regurgitation not assessed.  6. The aortic valve was not well visualized. Aortic valve regurgitation is not visualized.  7. Pulmonic valve regurgitation not assessed. FINDINGS  Left Ventricle: Left ventricular ejection fraction, by estimation, is 55 to 60%. The left ventricle has normal function. Left ventricular endocardial border not optimally defined to evaluate regional wall motion. Definity  contrast agent was given IV to delineate the left ventricular endocardial borders. The left ventricular internal cavity size was normal in size. Suboptimal image quality limits for assessment of left ventricular hypertrophy. Left ventricular diastolic function could not be evaluated. Right Ventricle: The right ventricular size is mildly enlarged. Right vetricular wall thickness was not assessed. Right ventricular systolic function is moderately reduced. Left Atrium: Left atrial size was not well visualized. Right Atrium: Right atrial size was not well visualized. Pericardium: There is no evidence of pericardial effusion. Mitral Valve: The mitral valve was not well visualized. No evidence of mitral valve regurgitation. Tricuspid Valve: The tricuspid valve is not well visualized. Tricuspid valve regurgitation not assessed. Aortic Valve: The aortic valve was not well visualized. Aortic valve regurgitation is not visualized. Aortic valve mean gradient measures 3.0 mmHg. Aortic valve peak gradient measures 7.4 mmHg. Aortic valve area, by VTI measures 3.11 cm. Pulmonic  Valve: The pulmonic valve was not well visualized. Pulmonic valve regurgitation not assessed. Aorta: The aortic root was not well visualized. IAS/Shunts: The interatrial septum was not well visualized. Additional Comments: Poor quality study do to poor acoustic windows.  LEFT VENTRICLE PLAX 2D LVIDd:         3.60 cm   Diastology LVIDs:         2.20 cm   LV e' medial:    5.98 cm/s LV PW:         1.00 cm   LV E/e' medial:  11.0 LV IVS:        1.00 cm   LV e' lateral:   9.25 cm/s LVOT diam:     2.00 cm   LV E/e' lateral: 7.1 LV SV:         63 LV SV Index:   28 LVOT Area:     3.14 cm  IVC IVC diam: 1.80 cm LEFT ATRIUM         Index LA diam:    3.90 cm 1.75 cm/m  AORTIC VALVE AV Area (Vmax):    2.59 cm AV Area (Vmean):   2.61 cm AV Area (VTI):     3.11 cm AV Vmax:           136.00 cm/s AV Vmean:          85.600 cm/s AV VTI:            0.201 m AV Peak Grad:       7.4 mmHg AV Mean Grad:      3.0 mmHg LVOT Vmax:         112.00 cm/s LVOT Vmean:        71.100 cm/s LVOT VTI:          0.199 m LVOT/AV VTI ratio: 0.99  AORTA Ao Root diam: 3.40 cm MITRAL VALVE MV Area (PHT): 2.41 cm    SHUNTS MV Decel Time: 315 msec    Systemic VTI:  0.20 m MV E velocity: 65.60 cm/s  Systemic Diam: 2.00 cm MV A velocity: 79.30 cm/s MV E/A ratio:  0.83 Charlton Haws MD Electronically signed by Charlton Haws MD Signature Date/Time: 11/27/2023/10:16:19 AM    Final    Overnight EEG with video Result Date: 11/27/2023 Charlsie Quest, MD       5:38 AM Patient Name: Vicki Murphy MRN: [id number] Epilepsy Attending: Charlsie Quest Referring Physician/Provider: Erick Blinks, MD Duration: 11/26/2023 2145 to 11/27/2023 2145  Patient history: 73yo F s/p cardiac arrest getting eeg to evaluate for seizure  Level of alertness: comatose  AEDs during EEG study: None  Technical aspects: This EEG study was done with scalp electrodes positioned according to the 10-20 International system of electrode placement. Electrical activity was reviewed with band pass filter of 1-70Hz , sensitivity of 7 uV/mm, display speed of 28mm/sec with a 60Hz  notched filter applied as appropriate. EEG data were recorded continuously and digitally stored.  Video monitoring was available and reviewed as appropriate.  Description: EEG showed continuous generalized low amplitude 3 to 6 Hz theta-delta slowing. Hyperventilation and photic stimulation were not performed.   Event button was pressed on 11/27/2023 at 0229 for facial twitching per RN ( unable to see on camera). Concomitant EEG before, during and after the event did not show any EEG to suggest seizure.  ABNORMALITY - Continuous slow, generalized  IMPRESSION: This study is suggestive of severe diffuse encephalopathy. No seizures or epileptiform discharges were seen throughout the recording. Event button  was pressed on 11/27/2023 at 0229 for facial twitching  without concomitant EEG change.  However focal motor seizures may not be seen on scalp EEG.  Clinical correlation is recommended.  Charlsie Quest    EEG adult Result Date: 11/27/2023 Charlsie Quest, MD     11/27/2023  8:40 AM Patient Name: Vicki Murphy MRN: [id number] Epilepsy Attending: Charlsie Quest Referring Physician/Provider: [name], PA-C Date: 11/26/2023 Duration: 22.52 mins Patient history: 73yo F s/p cardiac arrest getting eeg to evaluate for seizure Level of alertness: comatose AEDs during EEG study: None Technical aspects: This EEG study was done with scalp electrodes positioned according to the 10-20 International system of electrode placement. Electrical activity was reviewed with band pass filter of 1-70Hz , sensitivity of 7 uV/mm, display speed of 35mm/sec with a 60Hz  notched filter applied as appropriate. EEG data were recorded continuously and digitally stored.  Video monitoring was available and reviewed as appropriate. Description: EEG showed continuous generalized low amplitude 3 to 6 Hz theta-delta slowing. Hyperventilation and photic stimulation were not performed.   ABNORMALITY - Continuous slow, generalized IMPRESSION: This study is suggestive of severe diffuse encephalopathy. No seizures or epileptiform discharges were seen throughout the recording. Charlsie Quest   DG Chest Port 1 View Result Date: 11/26/2023 CLINICAL DATA:  Central line EXAM: PORTABLE CHEST 1 VIEW COMPARISON:  11/26/2023 FINDINGS: Endotracheal tube tip is about 2.9 cm superior to carina. Esophageal tube tip in the stomach. Left IJ central venous catheter tip at the proximal right atrium. Low lung volumes. Stable cardiomediastinal silhouette with vascular congestion and pleural effusions. Similar edema. Bibasilar airspace disease without significant change IMPRESSION: 1. Left IJ central venous catheter tip at the proximal right atrium. No pneumothorax. 2. Low lung volumes with vascular congestion,  edema, and pleural effusions. Bibasilar airspace disease without significant change. Electronically Signed   By: Jasmine Pang M.D.   On: 11/26/2023 23:11   CT Head Wo Contrast Result Date: 11/26/2023 CLINICAL DATA:  Head trauma, mental status change.  Neck trauma. EXAM: CT HEAD WITHOUT CONTRAST CT CERVICAL SPINE WITHOUT CONTRAST TECHNIQUE: Multidetector CT imaging of the head and cervical spine was performed following the standard protocol without intravenous contrast. Multiplanar CT image reconstructions of the cervical spine were also generated. RADIATION DOSE REDUCTION: This exam was performed according to the departmental dose-optimization program which includes automated exposure control, adjustment of the mA and/or kV according to patient size and/or use of iterative reconstruction technique. COMPARISON:  None Available. FINDINGS: CT HEAD FINDINGS Brain: No acute intracranial hemorrhage, midline shift or mass effect. No extra-axial fluid collection is seen. Mild periventricular white matter hypodensities are present bilaterally. No hydrocephalus. Vascular: No hyperdense vessel or unexpected calcification. Skull: Normal. Negative for fracture or focal lesion. Sinuses/Orbits: Diffuse mucosal thickening is noted in the paranasal sinuses. No acute orbital abnormality is seen. Other: None. CT CERVICAL SPINE FINDINGS Alignment: There is mild anterolisthesis at C4-C5. Skull base and vertebrae: No acute fracture. Soft tissues and spinal canal: No prevertebral fluid or swelling. No visible canal hematoma. Disc levels: Multilevel intervertebral disc space narrowing and advanced facet arthropathy, most pronounced at C5-C6. Upper chest: No acute abnormality. Other: Endotracheal and enteric tubes are noted. Rim calcified nodules are noted in the right lobe of the thyroid gland measuring up to 1.3 cm. No follow-up imaging is recommended. IMPRESSION: 1. No acute intracranial process. 2. 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 Electronically Signed   By: Thornell Sartorius  M.D.   On: 11/26/2023 20:00   CT Cervical Spine Wo Contrast Result Date: 11/26/2023 CLINICAL DATA:  Head trauma, mental status change.  Neck trauma. EXAM: CT HEAD WITHOUT CONTRAST CT CERVICAL SPINE WITHOUT CONTRAST TECHNIQUE: Multidetector CT imaging of the head and cervical spine was performed following the standard protocol without intravenous contrast. Multiplanar CT image reconstructions of the cervical spine were also generated. RADIATION DOSE REDUCTION: This exam was performed according to the departmental dose-optimization program which includes automated exposure control, adjustment of the mA and/or kV according to patient size and/or use of iterative reconstruction technique. COMPARISON:  None Available. FINDINGS: CT HEAD FINDINGS Brain: No acute intracranial hemorrhage, midline shift or mass effect. No extra-axial fluid collection is seen. Mild periventricular white matter hypodensities are present bilaterally. No hydrocephalus. Vascular: No hyperdense vessel or unexpected calcification. Skull: Normal. Negative for fracture or focal lesion. Sinuses/Orbits: Diffuse mucosal thickening is noted in the paranasal sinuses. No acute orbital abnormality is seen. Other: None. CT CERVICAL SPINE FINDINGS Alignment: There is mild anterolisthesis at C4-C5. Skull base and vertebrae: No acute fracture. Soft tissues and spinal canal: No prevertebral fluid or swelling. No visible canal hematoma. Disc levels: Multilevel intervertebral disc space narrowing and advanced facet arthropathy, most pronounced at C5-C6. Upper chest: No acute abnormality. Other: Endotracheal and enteric tubes are noted. Rim calcified nodules are noted in the right lobe of the thyroid gland measuring up to 1.3 cm. No follow-up imaging is recommended. IMPRESSION: 1. No acute intracranial process. 2. Advanced degenerative changes in the cervical spine  without evidence of acute fracture. Electronically Signed   By: Thornell Sartorius M.D.   On: 11/26/2023 20:00   DG Chest Port 1 View Result Date: 11/26/2023 CLINICAL DATA:  Respiratory failure, verify endotracheal tube placement. EXAM: PORTABLE CHEST 1 VIEW COMPARISON:  11/26/2023. FINDINGS: The heart size and mediastinal contours are within normal limits. Lung volumes are low with atelectasis at the lung bases. Mild interstitial prominence is noted bilaterally. No effusion or pneumothorax is seen. An endotracheal tube terminates 3.6 cm above the carina. An enteric tube courses over the left upper quadrant and out of the field of view. No acute osseous abnormality is seen. IMPRESSION: 1. Low lung volumes with atelectasis at the lung bases. 2. Support apparatus as described above. Electronically Signed   By: Thornell Sartorius M.D.   On: 11/26/2023 19:49   DG Chest Port 1 View Result Date: 11/26/2023 CLINICAL DATA:  Post intubation. EXAM: PORTABLE CHEST 1 VIEW COMPARISON:  X-ray 06/16/2005 FINDINGS: Enteric tube in place with the tip extending beneath the diaphragm. ET tube in place with the tip seen proximally 6 cm above the carina. This could be advanced 2 cm. Overlapping cardiac leads and defibrillator pads. Normal cardiopericardial silhouette. No pneumothorax or effusion. Diffuse bilateral interstitial changes. Acute process is possible. IMPRESSION: ET tube and enteric tube in place. ET tube seen 6 cm above the carina. This could be advanced 2 cm. Diffuse interstitial changes seen of the lungs. Edema versus infectious or inflammatory process is possible. Recommend follow up Electronically Signed   By: Karen Kays M.D.   On: 11/26/2023 13:33    Microbiology Recent Results (from the past 240 hours)  Resp panel by RT-PCR (RSV, Flu A&B, Covid) Anterior Nasal Swab     Status: None   Collection Time: 11/26/23 11:34 AM   Specimen: Anterior Nasal Swab  Result Value Ref Range Status   SARS Coronavirus 2 by RT PCR  NEGATIVE NEGATIVE Final  Comment: (NOTE) SARS-CoV-2 target nucleic acids are NOT DETECTED.  The SARS-CoV-2 RNA is generally detectable in upper respiratory specimens during the acute phase of infection. The lowest concentration of SARS-CoV-2 viral copies this assay can detect is 138 copies/mL. A negative result does not preclude SARS-Cov-2 infection and should not be used as the sole basis for treatment or other patient management decisions. A negative result may occur with  improper specimen collection/handling, submission of specimen other than nasopharyngeal swab, presence of viral mutation(s) within the areas targeted by this assay, and inadequate number of viral copies(<138 copies/mL). A negative result must be combined with clinical observations, patient history, and epidemiological information. The expected result is Negative.  Fact Sheet for Patients:  BloggerCourse.com  Fact Sheet for Healthcare Providers:  SeriousBroker.it  This test is no t yet approved or cleared by the [place] FDA and  has been authorized for detection and/or diagnosis of SARS-CoV-2 by FDA under an Emergency Use Authorization (EUA). This EUA will remain  in effect (meaning this test can be used) for the duration of the COVID-19 declaration under Section 564(b)(1) of the Act, 21 U.S.C.section 360bbb-3(b)(1), unless the authorization is terminated  or revoked sooner.       Influenza A by PCR NEGATIVE NEGATIVE Final   Influenza B by PCR NEGATIVE NEGATIVE Final    Comment: (NOTE) The Xpert Xpress SARS-CoV-2/FLU/RSV plus assay is intended as an aid in the diagnosis of influenza from Nasopharyngeal swab specimens and should not be used as a sole basis for treatment. Nasal washings and aspirates are unacceptable for Xpert Xpress SARS-CoV-2/FLU/RSV testing.  Fact Sheet for Patients: BloggerCourse.com  Fact Sheet for  Healthcare Providers: SeriousBroker.it  This test is not yet approved or cleared by the [place] FDA and has been authorized for detection and/or diagnosis of SARS-CoV-2 by FDA under an Emergency Use Authorization (EUA). This EUA will remain in effect (meaning this test can be used) for the duration of the COVID-19 declaration under Section 564(b)(1) of the Act, 21 U.S.C. section 360bbb-3(b)(1), unless the authorization is terminated or revoked.     Resp Syncytial Virus by PCR NEGATIVE NEGATIVE Final    Comment: (NOTE) Fact Sheet for Patients: BloggerCourse.com  Fact Sheet for Healthcare Providers: SeriousBroker.it  This test is not yet approved or cleared by the [place] FDA and has been authorized for detection and/or diagnosis of SARS-CoV-2 by FDA under an Emergency Use Authorization (EUA). This EUA will remain in effect (meaning this test can be used) for the duration of the COVID-19 declaration under Section 564(b)(1) of the Act, 21 U.S.C. section 360bbb-3(b)(1), unless the authorization is terminated or revoked.  Performed at [facility], [address] [place], [place] [address]   Urine Culture     Status: Abnormal   Collection Time: 11/26/23 11:54 AM   Specimen: Urine, Random  Result Value Ref Range Status   Specimen Description   Final    URINE, RANDOM Performed at [facility], [address] [place], [place] [address]    Special Requests   Final    NONE Reflexed from [id number] Performed at [facility], [address] [place], [place] [address]    Culture >=100,000 COLONIES/mL KLEBSIELLA PNEUMONIAE (A)  Final   Report Status  FINAL  Final   Organism ID, Bacteria KLEBSIELLA PNEUMONIAE (A)  Final      Susceptibility   Klebsiella pneumoniae - MIC*    AMPICILLIN >=32 RESISTANT Resistant     CEFAZOLIN <=4 SENSITIVE Sensitive  CEFEPIME <=0.12 SENSITIVE Sensitive      CEFTRIAXONE <=0.25 SENSITIVE Sensitive     CIPROFLOXACIN <=0.25 SENSITIVE Sensitive     GENTAMICIN <=1 SENSITIVE Sensitive     IMIPENEM 0.5 SENSITIVE Sensitive     NITROFURANTOIN 128 RESISTANT Resistant     TRIMETH/SULFA <=20 SENSITIVE Sensitive     AMPICILLIN/SULBACTAM 4 SENSITIVE Sensitive     PIP/TAZO <=4 SENSITIVE Sensitive ug/mL    * >=100,000 COLONIES/mL KLEBSIELLA PNEUMONIAE  Culture, blood (routine x 2)     Status: None   Collection Time: 11/26/23 12:17 PM   Specimen: BLOOD  Result Value Ref Range Status   Specimen Description BLOOD BLOOD LEFT ARM  Final   Special Requests   Final    BOTTLES DRAWN AEROBIC AND ANAEROBIC Blood Culture adequate volume   Culture   Final    NO GROWTH 5 DAYS Performed at [facility], [address], [place], [place] [address]    Report Status 12/01/2023 FINAL  Final  Culture, blood (routine x 2)     Status: None   Collection Time: 11/26/23  1:50 PM   Specimen: BLOOD LEFT ARM  Result Value Ref Range Status   Specimen Description BLOOD LEFT ARM  Final   Special Requests   Final    BOTTLES DRAWN AEROBIC AND ANAEROBIC Blood Culture adequate volume   Culture   Final    NO GROWTH 5 DAYS Performed at [facility], [address], [place], [place] [address]    Report Status 12/01/2023 FINAL  Final  MRSA Next Gen by PCR, Nasal     Status: None   Collection Time: 11/26/23  7:32 PM   Specimen: Nasal Mucosa; Nasal Swab  Result Value Ref Range Status   MRSA by PCR Next Gen NOT DETECTED NOT DETECTED Final    Comment: (NOTE) The GeneXpert MRSA Assay (FDA approved for NASAL specimens only), is one component of a comprehensive MRSA colonization surveillance program. It is not intended to diagnose MRSA infection nor to guide or monitor treatment for MRSA infections. Test performance is not FDA approved in patients less than 28 years old. Performed at [facility], [address] [place], [place] [address]   Culture, Respiratory w Gram Stain      Status: None   Collection Time: 11/26/23  9:04 PM   Specimen: Tracheal Aspirate; Respiratory  Result Value Ref Range Status   Specimen Description TRACHEAL ASPIRATE  Final   Special Requests NONE  Final   Gram Stain   Final    RARE WBC SEEN MODERATE YEAST MODERATE GRAM POSITIVE COCCI MODERATE GRAM NEGATIVE RODS RARE GRAM POSITIVE RODS Performed at [facility], [address] [place], [place] [address]    Culture   Final    ABUNDANT METHICILLIN RESISTANT STAPHYLOCOCCUS AUREUS ABUNDANT ESCHERICHIA COLI    Report Status 11/30/2023 FINAL  Final   Organism ID, Bacteria METHICILLIN RESISTANT STAPHYLOCOCCUS AUREUS  Final   Organism ID, Bacteria ESCHERICHIA COLI  Final      Susceptibility   Escherichia coli - MIC*    AMPICILLIN 4 SENSITIVE Sensitive     CEFEPIME <=0.12 SENSITIVE Sensitive     CEFTAZIDIME <=1 SENSITIVE Sensitive     CEFTRIAXONE <=0.25 SENSITIVE Sensitive     CIPROFLOXACIN <=0.25 SENSITIVE Sensitive     GENTAMICIN <=1 SENSITIVE Sensitive     IMIPENEM <=0.25 SENSITIVE Sensitive     TRIMETH/SULFA <=20 SENSITIVE Sensitive     AMPICILLIN/SULBACTAM <=2 SENSITIVE Sensitive     PIP/TAZO <=  4 SENSITIVE Sensitive ug/mL    * ABUNDANT ESCHERICHIA COLI   Methicillin resistant staphylococcus aureus - MIC*    CIPROFLOXACIN >=8 RESISTANT Resistant     ERYTHROMYCIN >=8 RESISTANT Resistant     GENTAMICIN <=0.5 SENSITIVE Sensitive     OXACILLIN >=4 RESISTANT Resistant     TETRACYCLINE <=1 SENSITIVE Sensitive     VANCOMYCIN 1 SENSITIVE Sensitive     TRIMETH/SULFA <=10 SENSITIVE Sensitive     CLINDAMYCIN <=0.25 SENSITIVE Sensitive     RIFAMPIN <=0.5 SENSITIVE Sensitive     Inducible Clindamycin NEGATIVE Sensitive     LINEZOLID 2 SENSITIVE Sensitive     * ABUNDANT METHICILLIN RESISTANT STAPHYLOCOCCUS AUREUS    Lab Basic Metabolic Panel: Recent Labs  Lab 11/26/23 1823 11/26/23 2104 11/26/23 2106 11/27/23 0420 11/27/23 0423 11/27/23 1610 11/27/23 2255  0445   0446  0912  1335  1500  1538  NA 133* 132*   < > 133*   < > 131*   < >  --  127* 127* 127* 133* 129*  K 5.9* 6.3*   < > 5.1   < > 4.2   < >  --  3.7 5.1 6.1* 5.0 5.4*  CL 97* 96*  --  93*  --  90*  --   --  87*  --   --  95*  --   CO2 <7* <7*  --  <7*  --  10*  --   --  16*  --   --  18*  --   GLUCOSE 140* 193*  --  148*  --  153*  --   --  104*  --   --  76  --   BUN 74* 73*  --  64*  --  45*  --   --  34*  --   --  25*  --   CREATININE 6.74* 6.57*  --  5.54*  --  3.80*  --   --  2.43*  --   --  1.71*  --   CALCIUM 8.1* 8.2*  --  7.4*  --  6.8*  --   --  6.2*  --   --  5.8*  --   MG 1.8 1.8  --  1.8  --   --   --  1.7  --   --   --   --   --   PHOS >30.0* >30.0*  --  11.1*  --  5.9*  --   --  3.8  --   --  5.8*  --    < > = values in this interval not displayed.   Liver Function Tests: Recent Labs  Lab 11/26/23 1221 11/26/23 1823 11/27/23 0420 11/27/23 1610  0445  0446  1500  AST 24  --  1,437*  --  488*  --   --   ALT 13  --  726*  --  439*  --   --   ALKPHOS 55  --  119  --  102  --   --   BILITOT 0.6  --  1.9*  --  2.2*  --   --   PROT 5.4*  --  5.2*  --  4.5*  --   --   ALBUMIN 2.5*   < > 2.4* 2.5* 2.3* 2.3* 1.6*   < > = values in this interval not displayed.   No results for input(s): "LIPASE", "AMYLASE" in the last 168 hours. No results for  input(s): "AMMONIA" in the last 168 hours. CBC: Recent Labs  Lab 11/26/23 1221 11/26/23 2106 11/27/23 0420 11/27/23 0423  0445  0912  1335  1500  1538  WBC 10.1  --  19.8*  --  6.8  --   --  3.2*  --   NEUTROABS 6.9  --   --   --   --   --   --   --   --   HGB 10.7*   < > 13.2   < > 11.1* 11.6* 10.5* 9.7* 9.2*  HCT 38.0   < > 44.1   < > 31.8* 34.0* 31.0* 27.5* 27.0*  MCV 92.9  --  87.7  --  75.2*  --   --  74.9*  --   PLT 235  --  284  --  142*  --   --  106*  --    < > = values in this interval not displayed.    Cardiac Enzymes: No results for input(s): "CKTOTAL", "CKMB", "CKMBINDEX", "TROPONINI" in the last 168 hours. Sepsis Labs: Recent Labs  Lab 11/26/23 1221 11/26/23 1350 11/26/23 1823 11/27/23 0420 11/27/23 0928 11/27/23 1230 11/27/23 1610  0445  1500  WBC 10.1  --   --  19.8*  --   --   --  6.8 3.2*  LATICACIDVEN >9.0*   < > >9.0*  --  >9.0* >9.0* >9.0*  --   --    < > = values in this interval not displayed.    Procedures/Operations  Intubation, central line placement, arterial line placement, CRRT, mechanical ventilation.   Essam Lowdermilk 12/02/2023, 10:10 AM

## 2023-12-12 NOTE — Procedures (Addendum)
Patient Name: Conny Javed  MRN: [id number]  Epilepsy Attending: Charlsie Quest  Referring Physician/Provider: Erick Blinks, MD  Duration: 11/27/2023 2145 to  1248   Patient history: 73yo F s/p cardiac arrest getting eeg to evaluate for seizure   Level of alertness: comatose   AEDs during EEG study: None   Technical aspects: This EEG study was done with scalp electrodes positioned according to the 10-20 International system of electrode placement. Electrical activity was reviewed with band pass filter of 1-70Hz , sensitivity of 7 uV/mm, display speed of 50mm/sec with a 60Hz  notched filter applied as appropriate. EEG data were recorded continuously and digitally stored.  Video monitoring was available and reviewed as appropriate.   Description: EEG showed continuous generalized low amplitude 3 to 6 Hz theta-delta slowing. Hyperventilation and photic stimulation were not performed.    Study was missing between  0311 to 0557 due to technical difficulty   ABNORMALITY - Continuous slow, generalized   IMPRESSION: This study is suggestive of severe diffuse encephalopathy. No seizures or epileptiform discharges were seen throughout the recording.  Nary Sneed [name]

## 2023-12-12 NOTE — IPAL (Addendum)
I met with the patient's sister, Christinia Gully, who is also her healthcare POA, at the bedside. The patient remains hypotensive despite maxed out levo, epi, and vaso. Her heart rate is also trending down, now in the 50s. I discussed code status with Aleathea, and she states that the patient would NOT want to be resuscitated again if she were to lose a pulse.   DNR order placed at this time.   The patient's sister is not ready to transition to comfort care and wishes for the ICU team to continue all current interventions.

## 2023-12-12 NOTE — Progress Notes (Signed)
Brief Nephrology Note  Patient worsening today. Significant hypotension despite aggressive pressor regimen. Getting to the point where she may not tolerate crrt. Family made patient DNR bit continuing other ongoing efforts. If she clots filter would not restart crrt and can consider restarting in the future if she becomes more stable. Unfortunately prognosis very poor.

## 2023-12-12 NOTE — Progress Notes (Signed)
Dr. Denese Killings and Dr. Ned Card notified about pt's new heart rhythm in a fib in the 70s, low SBP in the 80s despite increasing vasopressors, and inability to pull fluid to keep patient even on CRRT machine. Order for NS bolus received as well as ABG. Amiodarone order received.  Sodium bicarbonate drip ordered based off of ABG. Dr. Valentino Nose with nephrology updated as well. Changed rate of heparin in circuit based off of aPTT. 1700 lab order for aPTT received.

## 2023-12-12 NOTE — Progress Notes (Signed)
LTM EEG disconnected - no skin breakdown at unhook.  

## 2023-12-12 NOTE — Progress Notes (Signed)
ABG was obtained while patient was on ventilator settings of VT: 580, RR: 20, FIO2: 40%, and PEEP: 10 (was just increased due to decreased sats).  FIO2 increased to 60%.  Sats now at 95%.  Will continue to monitor.      Latest Reference Range & Units  09:12  Sample type  ARTERIAL  pH, Arterial 7.35 - 7.45  7.357  pCO2 arterial 32 - 48 mmHg 36.3  pO2, Arterial 83 - 108 mmHg 51 (L)  TCO2 22 - 32 mmol/L 21 (L)  Acid-base deficit 0.0 - 2.0 mmol/L 5.0 (H)  Bicarbonate 20.0 - 28.0 mmol/L 20.4  O2 Saturation % 85  Patient temperature  98.4 F  Collection site  art line

## 2023-12-12 NOTE — Procedures (Signed)
I saw and evaluated the patient on CRRT.  I reviewed the last 24 hours events.  Adjustments to CRRT prescription are made as needed.  Acidemia improving but persistent lactic acidosis.  Stop bicarbonate dialysis fluids and use regular bags.  2K pre and post.  4K dialysate.  Filed Weights   11/26/23 2300 11/27/23 0449  0500  Weight: 101.2 kg 101.2 kg 104.4 kg    Recent Labs  Lab  0446  NA 127*  K 3.7  CL 87*  CO2 16*  GLUCOSE 104*  BUN 34*  CREATININE 2.43*  CALCIUM 6.2*  PHOS 3.8    Recent Labs  Lab 11/26/23 1221 11/26/23 2106 11/27/23 0420 11/27/23 0423 11/27/23 0702 11/27/23 2255  0445  WBC 10.1  --  19.8*  --   --   --  6.8  NEUTROABS 6.9  --   --   --   --   --   --   HGB 10.7*   < > 13.2   < > 14.6 11.2* 11.1*  HCT 38.0   < > 44.1   < > 43.0 33.0* 31.8*  MCV 92.9  --  87.7  --   --   --  75.2*  PLT 235  --  284  --   --   --  142*   < > = values in this interval not displayed.    Scheduled Meds:  Chlorhexidine Gluconate Cloth  6 each Topical Q2000   heparin injection (subcutaneous)  5,000 Units Subcutaneous Q8H   hydrocortisone sod succinate (SOLU-CORTEF) inj  100 mg Intravenous BID   insulin aspart  0-15 Units Subcutaneous Q4H   levothyroxine  50 mcg Per Tube Q0600   multivitamin  1 tablet Per Tube QHS   mouth rinse  15 mL Mouth Rinse Q2H   pantoprazole (PROTONIX) IV  40 mg Intravenous Q24H   Continuous Infusions:  aztreonam Stopped ( 0249)   epinephrine 3 mcg/min ( 0800)   fentaNYL infusion INTRAVENOUS 50 mcg/hr ( 0800)   heparin 10,000 units/ 20 mL infusion syringe 500 Units/hr ( 0800)   norepinephrine (LEVOPHED) Adult infusion 49 mcg/min ( 0800)   prismasol BGK 2/2.5 replacement solution 500 mL/hr at  0736   prismasol BGK 2/2.5 replacement solution 300 mL/hr at  0739   prismasol BGK 4/2.5 2,000 mL/hr at  0735   vasopressin 0.04 Units/min ( 0800)   PRN  Meds:.fentaNYL (SUBLIMAZE) injection, heparin, heparin, mouth rinse   Louie Bun,  MD , 8:39 AM

## 2023-12-12 NOTE — Progress Notes (Signed)
Nephrology Follow-Up Consult note   Assessment/Recommendations: Vicki Murphy is a/an 73 y.o. female with a past medical history significant for DM2, achalasia, HTN, HLD, chronic pain, admitted for AMS, shock, PEA arrest, acute renal failure.       AKI: unclear baseline. ARF w/ severe electrolyte disturbance requiring CRRT on 1/16.  AKI likely ATN in the setting of severe shock and cardiac arrest.  Poor prognosis. -Continue CRRT, see procedure note for adjustments -advance goals of care conversations -Management of shock as below -Continue to monitor daily Cr, Dose meds for GFR -Monitor Daily I/Os, Daily weight  -Maintain MAP>65 for optimal renal perfusion.  -Avoid nephrotoxic medications including NSAIDs -Use synthetic opioids (Fentanyl/Dilaudid) if needed  Cardiac arrest: Continue workup per primary team.  Possibly related to sepsis  Septic shock: Severe requiring multiple pressors.  Continue management per primary team..  Some improvement  Lactic acidosis: Associated with shock.  CRRT as above.  Continue with shock management as above  AMS: Likely multifactorial.  Management per primary team  DM2: Management per primary team  Elevated LFTs: Likely shock liver.  Improving  Acute hypoxic respiratory failure: Management per primary team with ventilator   Recommendations conveyed to primary service.    [name] [facility]  8:40 AM  ___________________________________________________________  CC: cardiac arrest  Interval History/Subjective: Remains critically ill but acidemia improving.  Pressor requirement is decreasing slightly.   Medications:  Current Facility-Administered Medications  Medication Dose Route Frequency Provider Last Rate Last Admin   aztreonam (AZACTAM) 1 g in sodium chloride 0.9 % 100 mL IVPB  1 g Intravenous Q8H [name], MD   Stopped at  0249   Chlorhexidine Gluconate Cloth 2 % PADS 6 each  6  each Topical Q2000 Briant Sites, DO   6 each at 11/27/23 2115   EPINEPHrine (ADRENALIN) 5 mg in NS 250 mL (0.02 mg/mL) premix infusion  0.5-20 mcg/min Intravenous Titrated [name], MD 9 mL/hr at  0800 3 mcg/min at  0800   fentaNYL (SUBLIMAZE) injection 25-50 mcg  25-50 mcg Intravenous Q1H PRN Massie Maroon, MD   50 mcg at  0528   fentaNYL in NS (21mcg/ml) infusion-PREMIX  0-200 mcg/hr Intravenous Continuous Massie Maroon, MD 5 mL/hr at  0800 50 mcg/hr at  0800   heparin 10,000 units/ 20 mL infusion syringe  500-1,000 Units/hr CRRT Continuous [name], MD 1 mL/hr at  0800 500 Units/hr at  0800   heparin injection 1,000-6,000 Units  1,000-6,000 Units CRRT PRN [name], MD       heparin injection 5,000 Units  5,000 Units Subcutaneous Q8H [name], [name], DO   5,000 Units at  1191   heparinized saline (2000 units/L) primer fluid for CRRT   CRRT PRN [name], MD       hydrocortisone sodium succinate (SOLU-CORTEF) 100 MG injection 100 mg  100 mg Intravenous BID [name], [name], MD   100 mg at 11/27/23 2114   insulin aspart (novoLOG) injection 0-15 Units  0-15 Units Subcutaneous Q4H [name], [name], DO   3 Units at 11/27/23 2030   levothyroxine (SYNTHROID) tablet 50 mcg  50 mcg Per Tube [id number] [name], MD   50 mcg at  5621   multivitamin (RENA-VIT) tablet 1 tablet  1 tablet Per Tube QHS [name], MD       norepinephrine (LEVOPHED) 16 mg in (0.064 mg/mL) premix infusion  0-60 mcg/min Intravenous Titrated [name], MD 45.9 mL/hr at  0800 49  mcg/min at  0800   Oral care mouth rinse  15 mL Mouth Rinse Q2H Briant Sites, DO   15 mL at  1610   Oral care mouth rinse  15 mL Mouth Rinse PRN Briant Sites, DO       pantoprazole (PROTONIX) injection 40 mg  40 mg Intravenous Q24H [name], [name], DO   40 mg at 11/27/23 2115    prismasol BGK 2/2.5 replacement solution   CRRT Continuous [name], MD 500 mL/hr at  0736 New Bag at  0736   prismasol BGK 2/2.5 replacement solution   CRRT Continuous [name], MD 300 mL/hr at  0739 New Bag at  0739   prismasol BGK 4/2.5 infusion   CRRT Continuous [name], MD 2,000 mL/hr at  0735 New Bag at  0735   vasopressin (PITRESSIN) 20 Units in 100 mL (0.2 unit/mL) infusion-*FOR SHOCK*  0-0.04 Units/min Intravenous Continuous [name], MD 12 mL/hr at  0800 0.04 Units/min at  0800      Review of Systems: Unable to obtain due to the patient's sedation Physical Exam: Vitals:    0800  0815  BP: 97/63   Pulse: 90 89  Resp: (!) 27 (!) 28  Temp:    SpO2: 92% (!) 89%   Total I/O In: 283.2 [I.V.:194.4; IV Piggyback:88.7] Out: 234   Intake/Output Summary (Last 24 hours) at  0840 Last data filed at  0800 Gross per 24 hour  Intake 6477.35 ml  Output 1884 ml  Net 4593.35 ml   Constitutional: ill-appearing, lying in bed, sedated ENMT: ears and nose without scars or lesions, [place]/AT CV: normal rate, 1+ edema in the ble Respiratory:bilateral chest rise, ventilated, coarse upper airway sounds Gastrointestinal: soft, non-tender, no palpable masses or hernias Skin: no visible lesions or rashes Psych: sedated not interactive   Test Results I personally reviewed new and old clinical labs and radiology tests Lab Results  Component Value Date   NA 127 (L)    K 3.7    CL 87 (L)    CO2 16 (L)    BUN 34 (H)    CREATININE 2.43 (H)    CALCIUM 6.2 (LL)    ALBUMIN 2.3 (L)    PHOS 3.8     CBC Recent Labs  Lab 11/26/23 1221 11/26/23 2106 11/27/23 0420 11/27/23 0423 11/27/23 0702 11/27/23 2255  0445  WBC 10.1  --  19.8*  --   --   --  6.8  NEUTROABS 6.9   --   --   --   --   --   --   HGB 10.7*   < > 13.2   < > 14.6 11.2* 11.1*  HCT 38.0   < > 44.1   < > 43.0 33.0* 31.8*  MCV 92.9  --  87.7  --   --   --  75.2*  PLT 235  --  284  --   --   --  142*   < > = values in this interval not displayed.

## 2023-12-12 DEATH — deceased
